# Patient Record
Sex: Female | Born: 1960 | Race: White | Hispanic: No | Marital: Married | Smoking: Never smoker
Health system: Southern US, Community
[De-identification: ages and names within clinical notes are randomized; demographics above are authoritative.]

## PROBLEM LIST (undated history)

## (undated) DIAGNOSIS — R87619 Unspecified abnormal cytological findings in specimens from cervix uteri: Secondary | ICD-10-CM

## (undated) DIAGNOSIS — L292 Pruritus vulvae: Secondary | ICD-10-CM

## (undated) DIAGNOSIS — B3731 Acute candidiasis of vulva and vagina: Secondary | ICD-10-CM

## (undated) DIAGNOSIS — B373 Candidiasis of vulva and vagina: Secondary | ICD-10-CM

## (undated) DIAGNOSIS — I82409 Acute embolism and thrombosis of unspecified deep veins of unspecified lower extremity: Secondary | ICD-10-CM

## (undated) DIAGNOSIS — D049 Carcinoma in situ of skin, unspecified: Secondary | ICD-10-CM

## (undated) DIAGNOSIS — IMO0002 Reserved for concepts with insufficient information to code with codable children: Secondary | ICD-10-CM

## (undated) DIAGNOSIS — R102 Pelvic and perineal pain: Secondary | ICD-10-CM

## (undated) DIAGNOSIS — Z862 Personal history of diseases of the blood and blood-forming organs and certain disorders involving the immune mechanism: Secondary | ICD-10-CM

## (undated) DIAGNOSIS — N7011 Chronic salpingitis: Secondary | ICD-10-CM

## (undated) DIAGNOSIS — Z8742 Personal history of other diseases of the female genital tract: Secondary | ICD-10-CM

## (undated) DIAGNOSIS — M779 Enthesopathy, unspecified: Secondary | ICD-10-CM

## (undated) DIAGNOSIS — G43909 Migraine, unspecified, not intractable, without status migrainosus: Secondary | ICD-10-CM

## (undated) DIAGNOSIS — S83209A Unspecified tear of unspecified meniscus, current injury, unspecified knee, initial encounter: Secondary | ICD-10-CM

## (undated) DIAGNOSIS — R011 Cardiac murmur, unspecified: Secondary | ICD-10-CM

## (undated) HISTORY — PX: CERVICAL CONE BIOPSY: SUR198

## (undated) HISTORY — DX: Migraine, unspecified, not intractable, without status migrainosus: G43.909

## (undated) HISTORY — DX: Unspecified tear of unspecified meniscus, current injury, unspecified knee, initial encounter: S83.209A

## (undated) HISTORY — DX: Acute embolism and thrombosis of unspecified deep veins of unspecified lower extremity: I82.409

## (undated) HISTORY — PX: CYSTECTOMY: SUR359

## (undated) HISTORY — DX: Pelvic and perineal pain: R10.2

## (undated) HISTORY — PX: FOOT SURGERY: SHX648

## (undated) HISTORY — DX: Reserved for concepts with insufficient information to code with codable children: IMO0002

## (undated) HISTORY — DX: Pruritus vulvae: L29.2

## (undated) HISTORY — DX: Unspecified abnormal cytological findings in specimens from cervix uteri: R87.619

## (undated) HISTORY — DX: Chronic salpingitis: N70.11

## (undated) HISTORY — PX: ABDOMINAL HYSTERECTOMY: SHX81

## (undated) HISTORY — DX: Enthesopathy, unspecified: M77.9

## (undated) HISTORY — DX: Personal history of other diseases of the female genital tract: Z87.42

## (undated) HISTORY — DX: Candidiasis of vulva and vagina: B37.3

## (undated) HISTORY — DX: Acute candidiasis of vulva and vagina: B37.31

## (undated) HISTORY — PX: ELBOW SURGERY: SHX618

## (undated) HISTORY — PX: TONSILLECTOMY: SUR1361

## (undated) HISTORY — DX: Personal history of diseases of the blood and blood-forming organs and certain disorders involving the immune mechanism: Z86.2

## (undated) HISTORY — DX: Cardiac murmur, unspecified: R01.1

## (undated) HISTORY — DX: Carcinoma in situ of skin, unspecified: D04.9

---

## 1997-09-08 ENCOUNTER — Other Ambulatory Visit: Admission: RE | Admit: 1997-09-08 | Discharge: 1997-09-08 | Payer: Self-pay | Admitting: Obstetrics and Gynecology

## 1998-07-20 ENCOUNTER — Emergency Department (HOSPITAL_COMMUNITY): Admission: EM | Admit: 1998-07-20 | Discharge: 1998-07-20 | Payer: Self-pay | Admitting: Emergency Medicine

## 1998-08-10 ENCOUNTER — Other Ambulatory Visit: Admission: RE | Admit: 1998-08-10 | Discharge: 1998-08-10 | Payer: Self-pay | Admitting: Obstetrics and Gynecology

## 1999-08-18 ENCOUNTER — Other Ambulatory Visit: Admission: RE | Admit: 1999-08-18 | Discharge: 1999-08-18 | Payer: Self-pay | Admitting: Obstetrics and Gynecology

## 2000-11-12 ENCOUNTER — Other Ambulatory Visit: Admission: RE | Admit: 2000-11-12 | Discharge: 2000-11-12 | Payer: Self-pay | Admitting: Obstetrics and Gynecology

## 2001-04-29 ENCOUNTER — Ambulatory Visit (HOSPITAL_COMMUNITY): Admission: RE | Admit: 2001-04-29 | Discharge: 2001-04-29 | Payer: Self-pay | Admitting: Family Medicine

## 2001-04-29 ENCOUNTER — Encounter: Payer: Self-pay | Admitting: Family Medicine

## 2001-09-30 ENCOUNTER — Ambulatory Visit (HOSPITAL_COMMUNITY): Admission: RE | Admit: 2001-09-30 | Discharge: 2001-09-30 | Payer: Self-pay | Admitting: Obstetrics and Gynecology

## 2001-09-30 ENCOUNTER — Encounter: Payer: Self-pay | Admitting: Obstetrics and Gynecology

## 2001-11-12 ENCOUNTER — Other Ambulatory Visit: Admission: RE | Admit: 2001-11-12 | Discharge: 2001-11-12 | Payer: Self-pay | Admitting: Obstetrics and Gynecology

## 2001-11-26 ENCOUNTER — Ambulatory Visit (HOSPITAL_COMMUNITY): Admission: RE | Admit: 2001-11-26 | Discharge: 2001-11-26 | Payer: Self-pay | Admitting: Obstetrics and Gynecology

## 2001-11-26 ENCOUNTER — Encounter: Payer: Self-pay | Admitting: Obstetrics and Gynecology

## 2002-11-19 ENCOUNTER — Other Ambulatory Visit: Admission: RE | Admit: 2002-11-19 | Discharge: 2002-11-19 | Payer: Self-pay | Admitting: Obstetrics and Gynecology

## 2002-11-28 ENCOUNTER — Ambulatory Visit (HOSPITAL_COMMUNITY): Admission: RE | Admit: 2002-11-28 | Discharge: 2002-11-28 | Payer: Self-pay | Admitting: Obstetrics and Gynecology

## 2002-11-28 ENCOUNTER — Encounter: Payer: Self-pay | Admitting: Obstetrics and Gynecology

## 2003-12-23 ENCOUNTER — Ambulatory Visit (HOSPITAL_COMMUNITY): Admission: RE | Admit: 2003-12-23 | Discharge: 2003-12-23 | Payer: Self-pay | Admitting: Obstetrics and Gynecology

## 2004-10-16 ENCOUNTER — Emergency Department (HOSPITAL_COMMUNITY): Admission: EM | Admit: 2004-10-16 | Discharge: 2004-10-16 | Payer: Self-pay | Admitting: Emergency Medicine

## 2004-12-08 ENCOUNTER — Other Ambulatory Visit: Admission: RE | Admit: 2004-12-08 | Discharge: 2004-12-08 | Payer: Self-pay | Admitting: Obstetrics and Gynecology

## 2004-12-12 ENCOUNTER — Ambulatory Visit (HOSPITAL_COMMUNITY): Admission: RE | Admit: 2004-12-12 | Discharge: 2004-12-12 | Payer: Self-pay | Admitting: Obstetrics and Gynecology

## 2004-12-23 ENCOUNTER — Ambulatory Visit (HOSPITAL_COMMUNITY): Admission: RE | Admit: 2004-12-23 | Discharge: 2004-12-23 | Payer: Self-pay | Admitting: Obstetrics and Gynecology

## 2005-01-03 ENCOUNTER — Encounter: Admission: RE | Admit: 2005-01-03 | Discharge: 2005-01-03 | Payer: Self-pay | Admitting: Obstetrics and Gynecology

## 2006-01-08 ENCOUNTER — Other Ambulatory Visit: Admission: RE | Admit: 2006-01-08 | Discharge: 2006-01-08 | Payer: Self-pay | Admitting: Obstetrics and Gynecology

## 2006-02-22 ENCOUNTER — Encounter: Admission: RE | Admit: 2006-02-22 | Discharge: 2006-02-22 | Payer: Self-pay | Admitting: Obstetrics and Gynecology

## 2007-03-26 ENCOUNTER — Encounter: Admission: RE | Admit: 2007-03-26 | Discharge: 2007-03-26 | Payer: Self-pay | Admitting: Obstetrics and Gynecology

## 2007-03-26 ENCOUNTER — Ambulatory Visit: Payer: Self-pay | Admitting: Cardiology

## 2007-04-08 ENCOUNTER — Ambulatory Visit: Payer: Self-pay

## 2007-04-08 ENCOUNTER — Encounter: Payer: Self-pay | Admitting: Cardiology

## 2007-07-12 ENCOUNTER — Encounter: Admission: RE | Admit: 2007-07-12 | Discharge: 2007-07-12 | Payer: Self-pay | Admitting: Orthopaedic Surgery

## 2008-04-14 ENCOUNTER — Encounter: Admission: RE | Admit: 2008-04-14 | Discharge: 2008-04-14 | Payer: Self-pay | Admitting: Obstetrics and Gynecology

## 2009-04-16 ENCOUNTER — Encounter: Admission: RE | Admit: 2009-04-16 | Discharge: 2009-04-16 | Payer: Self-pay | Admitting: Obstetrics and Gynecology

## 2010-01-10 ENCOUNTER — Ambulatory Visit: Payer: Self-pay | Admitting: Family Medicine

## 2010-01-10 DIAGNOSIS — E663 Overweight: Secondary | ICD-10-CM

## 2010-01-10 DIAGNOSIS — M25559 Pain in unspecified hip: Secondary | ICD-10-CM

## 2010-01-28 ENCOUNTER — Encounter: Admission: RE | Admit: 2010-01-28 | Discharge: 2010-01-28 | Payer: Self-pay | Admitting: Orthopaedic Surgery

## 2010-02-02 ENCOUNTER — Encounter: Admission: RE | Admit: 2010-02-02 | Discharge: 2010-02-02 | Payer: Self-pay | Admitting: Orthopaedic Surgery

## 2010-04-18 ENCOUNTER — Encounter: Admission: RE | Admit: 2010-04-18 | Discharge: 2010-04-18 | Payer: Self-pay | Admitting: Obstetrics and Gynecology

## 2010-05-18 ENCOUNTER — Ambulatory Visit
Admission: RE | Admit: 2010-05-18 | Discharge: 2010-05-18 | Payer: Self-pay | Source: Home / Self Care | Attending: Internal Medicine | Admitting: Internal Medicine

## 2010-06-12 ENCOUNTER — Encounter: Payer: Self-pay | Admitting: Obstetrics and Gynecology

## 2010-06-12 ENCOUNTER — Encounter: Payer: Self-pay | Admitting: Orthopaedic Surgery

## 2010-06-19 LAB — CONVERTED CEMR LAB
Albumin: 4.3 g/dL (ref 3.5–5.2)
Alkaline Phosphatase: 45 units/L (ref 39–117)
BUN: 18 mg/dL (ref 6–23)
Basophils Relative: 0.4 % (ref 0.0–3.0)
Bilirubin Urine: NEGATIVE
Blood in Urine, dipstick: NEGATIVE
CO2: 24 meq/L (ref 19–32)
Calcium: 9.6 mg/dL (ref 8.4–10.5)
Cholesterol: 169 mg/dL (ref 0–200)
Eosinophils Absolute: 0.1 10*3/uL (ref 0.0–0.7)
Eosinophils Relative: 1.6 % (ref 0.0–5.0)
HDL: 51.1 mg/dL (ref >39.00)
Hemoglobin: 14.4 g/dL (ref 12.0–15.0)
Ketones, urine, test strip: NEGATIVE
LDL Cholesterol: 102 mg/dL — ABNORMAL HIGH (ref 0–99)
Monocytes Absolute: 0.5 10*3/uL (ref 0.1–1.0)
Monocytes Relative: 7.6 % (ref 3.0–12.0)
Platelets: 259 10*3/uL (ref 150.0–400.0)
Sodium: 141 meq/L (ref 135–145)
Total Bilirubin: 0.4 mg/dL (ref 0.3–1.2)
Total Protein: 6.7 g/dL (ref 6.0–8.3)
Triglycerides: 79 mg/dL (ref 0.0–149.0)
VLDL: 15.8 mg/dL (ref 0.0–40.0)
WBC: 7.1 10*3/uL (ref 4.5–10.5)

## 2010-06-21 NOTE — Assessment & Plan Note (Signed)
Summary: NEW PT EST // RS   Vital Signs:  Patient profile:   50 year old female Menstrual status:  hysterectomy Height:      59.75 inches Weight:      211 pounds BMI:     41.70 Temp:     98.7 degrees F oral BP sitting:   120 / 80  (left arm) Cuff size:   regular  Vitals Entered By: Kern Reap CMA Duncan Dull) (January 10, 2010 9:50 AM) CC: new to establish  Is Patient Diabetic? No Pain Assessment Patient in pain? no          Menstrual Status hysterectomy Last PAP Date 12/09/2009   CC:  new to establish .  History of Present Illness: Teresa Reese is a 50 year old, married female, nonsmoker, who comes in today as a new patient to get established and to have a complete physical examination  She has a history of underlying migraine headaches for which she takes Topamax 100 mg daily Relpax p.r.n.  Two months ago she began having pain in her left hip.  No history of trauma.  Right hip normal.  She is overweight 211 pounds.  a couple of years ago.  She went on a diet lost 97 pounds, cut down to 160 and then has gained it back.  She had  eye care.  Dental care.  Cardiac evaluation by Dr. Jens Som because of family history cardiomyopathy and heart murmur.  She was told she had a murmur, but is not significant, and echocardiogram, normal.  She gets an annual pelvic examination by GYN.  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  Ovaries were left intact  Tetanus booster 3 years ago.  History of DVT with first pregnancy.  Therefore, no HRT.    Preventive Screening-Counseling & Management  Alcohol-Tobacco     Smoking Status: never  Hep-HIV-STD-Contraception     Dental Visit-last 6 months yes  Allergies (verified): No Known Drug Allergies  Past History:  Past medical, surgical, family and social histories (including risk factors) reviewed, and no changes noted (except as noted below).  Past Medical History: tendonitis torn  meniscus DVT heart murmur migraine headaches  Past Surgical History: Caesarean section Hysterectomy Tonsillectomy cyst removed from wrist right knee surgery torn cartilage  Family History: Reviewed history and no changes required. Father: heart disease Mother: liver transplant 2008 Siblings: 4 brothers - healthy, 1 sister - asthma, migraine  Social History: Reviewed history and no changes required. Occupation: Diplomatic Services operational officer, Musician Married Never Smoked Alcohol use-no Smoking Status:  never Dental Care w/in 6 mos.:  yes  Review of Systems      See HPI  Physical Exam  General:  Well-developed,well-nourished,in no acute distress; alert,appropriate and cooperative throughout examination Head:  Normocephalic and atraumatic without obvious abnormalities. No apparent alopecia or balding. Eyes:  No corneal or conjunctival inflammation noted. EOMI. Perrla. Funduscopic exam benign, without hemorrhages, exudates or papilledema. Vision grossly normal. Ears:  External ear exam shows no significant lesions or deformities.  Otoscopic examination reveals clear canals, tympanic membranes are intact bilaterally without bulging, retraction, inflammation or discharge. Hearing is grossly normal bilaterally. Nose:  External nasal examination shows no deformity or inflammation. Nasal mucosa are pink and moist without lesions or exudates. Mouth:  Oral mucosa and oropharynx without lesions or exudates.  Teeth in good repair. Neck:  No deformities, masses, or tenderness noted. Chest Wall:  No deformities, masses, or tenderness noted. Breasts:  No mass, nodules,  thickening, tenderness, bulging, retraction, inflamation, nipple discharge or skin changes noted.   Lungs:  Normal respiratory effort, chest expands symmetrically. Lungs are clear to auscultation, no crackles or wheezes. Heart:  Normal rate and regular rhythm. S1 and S2 normal without gallop, murmur, click, rub or other extra sounds. Abdomen:   Bowel sounds positive,abdomen soft and non-tender without masses, organomegaly or hernias noted. Msk:  right hip normal left hip, severe pain with external rotation Pulses:  R and L carotid,radial,femoral,dorsalis pedis and posterior tibial pulses are full and equal bilaterally Extremities:  No clubbing, cyanosis, edema, or deformity noted with normal full range of motion of all joints.   Neurologic:  No cranial nerve deficits noted. Station and gait are normal. Plantar reflexes are down-going bilaterally. DTRs are symmetrical throughout. Sensory, motor and coordinative functions appear intact. Skin:  Intact without suspicious lesions or rashes Cervical Nodes:  No lymphadenopathy noted Axillary Nodes:  No palpable lymphadenopathy Inguinal Nodes:  No significant adenopathy Psych:  Cognition and judgment appear intact. Alert and cooperative with normal attention span and concentration. No apparent delusions, illusions, hallucinations   Impression & Recommendations:  Problem # 1:  Preventive Health Care (ICD-V70.0) Assessment Comment Only  Problem # 2:  HIP PAIN, LEFT (ICD-719.45) Assessment: New  Orders: Venipuncture (01027) TLB-Lipid Panel (80061-LIPID) TLB-BMP (Basic Metabolic Panel-BMET) (80048-METABOL) TLB-Hepatic/Liver Function Pnl (80076-HEPATIC) TLB-TSH (Thyroid Stimulating Hormone) (84443-TSH) TLB-CBC Platelet - w/Differential (85025-CBCD) T-Hip Comp Left Min 2-views (73510TC) T-Hip Comp Right Min 2 views (73510TC) Prescription Created Electronically [id number]) UA Dipstick w/o Micro (automated)  (81003) Prescription Created Electronically [id number])  Problem # 3:  OVERWEIGHT (ICD-278.02) Assessment: New  Orders: Venipuncture (74259) TLB-Lipid Panel (80061-LIPID) TLB-BMP (Basic Metabolic Panel-BMET) (80048-METABOL) TLB-Hepatic/Liver Function Pnl (80076-HEPATIC) TLB-TSH (Thyroid Stimulating Hormone) (84443-TSH) TLB-CBC Platelet - w/Differential (85025-CBCD) Prescription  Created Electronically [id number]) UA Dipstick w/o Micro (automated)  (81003) Prescription Created Electronically [id number])  Complete Medication List: 1)  Topamax 100 Mg Tabs (Topiramate) .... Take one tab by mouth once daily 2)  Calcium 1200-1000 Mg-unit Chew (Calcium carbonate-vit d-min) .... Take one tab by mouth once daily 3)  Relpax 20 Mg Tabs (Eletriptan hydrobromide) .Marland Kitchen.. 1 tab on set migraine  headache  Other Orders: EKG w/ Interpretation (93000)  Patient Instructions: 1)  begin a 1500-calorie no carb..............      .  No fat diet......... drink 30 ounces of water a day, daily weights in the morning, I will call you I get the report on your lab work in your x-rays. 2)  Take Motrin 600 mg twice daily with food for the left hip pain 3)  Schedule your mammogram. Prescriptions: RELPAX 20 MG TABS (ELETRIPTAN HYDROBROMIDE) 1 tab on set migraine  headache  #6 x 2   Entered and Authorized by:   Roderick Pee MD   Signed by:   Roderick Pee MD on 01/10/2010   Method used:   Electronically to        [facility]. [phone]* (retail)       [phone]       [place], [place]  [address]       Ph: [phone]       Fax: [phone]   RxID:   [id number] TOPAMAX 100 MG TABS (TOPIRAMATE) take one tab by mouth once daily  #100 x 3   Entered and Authorized by:   Roderick Pee MD   Signed by:   Roderick Pee MD on 01/10/2010  Method used:   Electronically to        [facility]. [phone]* (retail)       [phone]       [place], [place]  [address]       Ph: [phone]       Fax: [phone]   RxID:   [id number]   Laboratory Results   Urine Tests    Routine Urinalysis   Color: yellow Appearance: Clear Glucose: negative   (Normal Range: Negative) Bilirubin: negative   (Normal Range: Negative) Ketone: negative   (Normal Range: Negative) Spec. Gravity: 1.015   (Normal Range:  1.003-1.035) Blood: negative   (Normal Range: Negative) pH: 5.0   (Normal Range: 5.0-8.0) Protein: negative   (Normal Range: Negative) Urobilinogen: 0.2   (Normal Range: 0-1) Nitrite: negative   (Normal Range: Negative) Leukocyte Esterace: negative   (Normal Range: Negative)    Comments: Rita Ohara  January 10, 2010 12:02 PM

## 2010-06-23 NOTE — Assessment & Plan Note (Signed)
Summary: ?sinus inf/cjr   Vital Signs:  Patient profile:   50 year old female Menstrual status:  hysterectomy Weight:      213 pounds Pulse rate:   64 / minute BP sitting:   116 / 78  (left arm)  Vitals Entered By: Kyung Rudd, CMA (May 18, 2010 9:14 AM) CC: ?sinus inf   CC:  ?sinus inf.  History of Present Illness: Patient presents to clinic as a workin for evaluation of sinus discomfort. Notes 1+wk h/o frontal and B maxillary sinus pain and pressure. +green/yellow nasal drainage and cough prod for green sputum. No f/c or teeth pain. Does have h/o recurrent sinusitis typically responsive to abx tx. No sick exposure.  Current Medications (verified): 1)  Topamax 100 Mg Tabs (Topiramate) .... Take One Tab By Mouth Once Daily 2)  Calcium 1200-1000 Mg-Unit Chew (Calcium Carbonate-Vit D-Min) .... Take One Tab By Mouth Once Daily 3)  Relpax 20 Mg Tabs (Eletriptan Hydrobromide) .Marland Kitchen.. 1 Tab On Set Migraine  Headache  Allergies (verified): No Known Drug Allergies  Past History:  Past medical, surgical, family and social histories (including risk factors) reviewed for relevance to current acute and chronic problems.  Past Medical History: Reviewed history from 01/10/2010 and no changes required. tendonitis torn meniscus DVT heart murmur migraine headaches  Past Surgical History: Reviewed history from 01/10/2010 and no changes required. Caesarean section Hysterectomy Tonsillectomy cyst removed from wrist right knee surgery torn cartilage  Family History: Reviewed history from 01/10/2010 and no changes required. Father: heart disease Mother: liver transplant 2008 Siblings: 4 brothers - healthy, 1 sister - asthma, migraine  Social History: Reviewed history from 01/10/2010 and no changes required. Occupation: Diplomatic Services operational officer, Musician Married Never Smoked Alcohol use-no  Review of Systems      See HPI General:  Denies chills, fever, and sweats. Eyes:  Denies  discharge, eye irritation, eye pain, and red eye. ENT:  Complains of nasal congestion and sinus pressure; denies difficulty swallowing, ear discharge, earache, and sore throat. Resp:  Complains of cough and sputum productive; denies coughing up blood, shortness of breath, and wheezing.  Physical Exam  General:  Well-developed,well-nourished,in no acute distress; alert,appropriate and cooperative throughout examination Head:  Normocephalic and atraumatic without obvious abnormalities. No apparent alopecia or balding.+tenderness over maxillary and frontal sinuses Eyes:  pupils equal, pupils round, corneas and lenses clear, and no injection.   Ears:  External ear exam shows no significant lesions or deformities.  Otoscopic examination reveals clear canals, tympanic membranes are intact bilaterally without bulging, retraction, inflammation or discharge. Hearing is grossly normal bilaterally. Nose:  no external erythema and no nasal discharge.   Mouth:  Oral mucosa and oropharynx without lesions or exudates.  Teeth in good repair. Neck:  No deformities, masses, or tenderness noted. Lungs:  Normal respiratory effort, chest expands symmetrically. Lungs are clear to auscultation, no crackles or wheezes. Heart:  Normal rate and regular rhythm. S1 and S2 normal without gallop, murmur, click, rub or other extra sounds.   Impression & Recommendations:  Problem # 1:  ACUTE MAXILLARY SINUSITIS (ICD-461.0) Assessment New Begin abx tx as well as antitussive as needed. Cautioned regarding possible sedating effect. Followup if no improvement or worsening.  Her updated medication list for this problem includes:    Amoxicillin 875 Mg Tabs (Amoxicillin) ..... One by mouth bid    Hydrocod Polst-chlorphen Polst 10-8 Mg/51ml Lqcr (Hydrocod polst-chlorphen polst) .Marland KitchenMarland KitchenMarland KitchenMarland Kitchen 5ml po q12 hours as needed cough  Complete Medication List: 1)  Topamax 100 Mg Tabs (  Topiramate) .... Take one tab by mouth once daily 2)  Calcium  1200-1000 Mg-unit Chew (Calcium carbonate-vit d-min) .... Take one tab by mouth once daily 3)  Relpax 20 Mg Tabs (Eletriptan hydrobromide) .Marland Kitchen.. 1 tab on set migraine  headache 4)  Amoxicillin 875 Mg Tabs (Amoxicillin) .... One by mouth bid 5)  Hydrocod Polst-chlorphen Polst 10-8 Mg/81ml Lqcr (Hydrocod polst-chlorphen polst) .... 5ml po q12 hours as needed cough Prescriptions: HYDROCOD POLST-CHLORPHEN POLST 10-8 MG/5ML LQCR (HYDROCOD POLST-CHLORPHEN POLST) 5ml po q12 hours as needed cough  #4 ounces x 0   Entered and Authorized by:   Edwyna Perfect MD   Signed by:   Edwyna Perfect MD on 05/18/2010   Method used:   Print then Give to Patient   RxID:   [id number] AMOXICILLIN 875 MG TABS (AMOXICILLIN) one by mouth bid  #14 x 0   Entered and Authorized by:   Edwyna Perfect MD   Signed by:   Edwyna Perfect MD on 05/18/2010   Method used:   Print then Give to Patient   RxID:   [id number]    Orders Added: 1)  Est. Patient Level III [84696]

## 2010-06-28 ENCOUNTER — Telehealth: Payer: Self-pay | Admitting: Family Medicine

## 2010-06-28 MED ORDER — ELETRIPTAN HYDROBROMIDE 40 MG PO TABS: 40.0000 mg | ORAL_TABLET | ORAL | Status: DC | PRN

## 2010-06-28 NOTE — Telephone Encounter (Signed)
Rx Relpax 40mg  every 7 days [facility] at [address]

## 2010-08-09 ENCOUNTER — Encounter: Payer: Self-pay | Admitting: Family Medicine

## 2010-08-09 ENCOUNTER — Ambulatory Visit (INDEPENDENT_AMBULATORY_CARE_PROVIDER_SITE_OTHER): Payer: 59 | Admitting: Family Medicine

## 2010-08-09 VITALS — BP 130/88 | Temp 99.4°F | Wt 218.0 lb

## 2010-08-09 DIAGNOSIS — J45901 Unspecified asthma with (acute) exacerbation: Secondary | ICD-10-CM

## 2010-08-09 MED ORDER — HYDROCODONE-HOMATROPINE 5-1.5 MG/5ML PO SYRP: 2.5000 mL | ORAL_SOLUTION | Freq: Four times a day (QID) | ORAL | Status: AC | PRN

## 2010-08-09 MED ORDER — PREDNISONE 20 MG PO TABS: ORAL_TABLET | ORAL | Status: DC

## 2010-08-09 NOTE — Progress Notes (Signed)
  Subjective:    Patient ID: Teresa Reese, female    DOB: October 31, 1960, 50 y.o.   MRN: [id number]  HPI  Kristl is a 50 year old managing a nonsmoker, who comes in today with a 5-day history of fever, chills, and cough.  She had fever, chills, for for 5 days.  They are gone away, but the cough has gotten worse.  In the, past.  She's had pneumonia twice.  She's also had a history of wheezing when she gets a viral syndrome on occasion.  Review of Systems    General and pulmonary views systems otherwise negative Objective:   Physical Exam    Well-developed well-nourished, female, in no acute distress.  HEENT negative.  Neck supple.  No adenopathy.  Lungs are clear except for late expiratory wheezing    Assessment & Plan:  Viral syndrome with secondary asthma....... Prednisone burst and taper...... Lots of water......... Hydromet p.r.n. cough

## 2010-08-09 NOTE — Patient Instructions (Signed)
Take to prednisone tablets now then starting tomorrow morning two tabs every morning for 3 days or until you feel a lot better........ Then taper by taking one tab x 3 days, a half x 3 days, then half a tablet Monday, Wednesday, Friday, for a two week taper.  Drink lots of water.  Hydromet one half to 1 teaspoon nightly p.r.n. Cough.  Return p.r.n.

## 2010-08-15 ENCOUNTER — Telehealth: Payer: Self-pay | Admitting: *Deleted

## 2010-08-15 NOTE — Telephone Encounter (Signed)
Office visit for evaluation

## 2010-08-15 NOTE — Telephone Encounter (Signed)
Pt. Thinks she got a sinus infection from the prednisone and has teeth and face pain with drainage.  [phone] [facility].

## 2010-08-16 NOTE — Telephone Encounter (Signed)
Done

## 2010-08-17 ENCOUNTER — Encounter: Payer: Self-pay | Admitting: Family Medicine

## 2010-08-17 ENCOUNTER — Ambulatory Visit (INDEPENDENT_AMBULATORY_CARE_PROVIDER_SITE_OTHER): Payer: 59 | Admitting: Family Medicine

## 2010-08-17 DIAGNOSIS — IMO0001 Reserved for inherently not codable concepts without codable children: Secondary | ICD-10-CM

## 2010-08-17 DIAGNOSIS — J45901 Unspecified asthma with (acute) exacerbation: Secondary | ICD-10-CM

## 2010-08-17 DIAGNOSIS — J301 Allergic rhinitis due to pollen: Secondary | ICD-10-CM

## 2010-08-17 MED ORDER — AMOXICILLIN 500 MG PO CAPS: ORAL_CAPSULE | ORAL | Status: AC

## 2010-08-17 NOTE — Patient Instructions (Signed)
Prednisone two tabs x 3 days, one x 3 days, a half x 3 days, then half a tablet Monday, Wednesday, Friday, for a two week taper.  Afrin nasal spray, one shot up each nostril at bedtime.  If after one week.  He still did not see any improvement with the increase in prednisone and then begin amoxicillin 500 mg 3 times a day until bottle empty.  Remember antibiotics can cause vaginitis and/or diarrhea.  If you get diarrhea stopped the medicine immediately and call us

## 2010-08-17 NOTE — Progress Notes (Signed)
  Subjective:    Patient ID: Teresa Reese, female    DOB: August 08, 1960, 50 y.o.   MRN: [id number]  Teresa Reese is a 50 year old, married female, nonsmoker, who comes back today for evaluation of allergic rhinitis and asthma.  We saw a week ago with allergic rhinitis and asthma and began a short course of prednisone 40 mg daily x 3 days with a taper.  She states the wheezing is gone, but she still has a lot of head congestion.  Review of systems otherwise negative.    Review of Systems    General and immunologic review of systems otherwise negative Objective:   Physical Exam    Well-developed well-nourished, female in no acute distress.  HEENT negative except for 3+ nasal edema.  Next upper lungs are clear.  No wheezing    Assessment & Plan:  Allergic rhinitis, asthma.......Marland Kitchen Asthma resolved,,,,,,,,, plan go back up on a prednisone 40 mg daily x 3 days with a taper and afrin nasal spray nightly x 5 nights.  Patient is insistent that she has a sinus infection.  Therefore, I will give her a prescription for amoxicillin to take if her symptoms do not abate after we increased the prednisone

## 2010-10-04 NOTE — Assessment & Plan Note (Signed)
[facility]                            CARDIOLOGY OFFICE NOTE   JAMIEKA, ROYLE                       MRN:          [id number]  DATE:03/26/2007                            DOB:          1960/09/06    HISTORY:  Ms. Culliton is a pleasant 50 year old female who presents for  evaluation of chest pain and a murmur.  I saw her in August 2004, for  atypical chest pain.  At that time we did perform an echocardiogram that  showed normal LV function.  There was no significant valvular  abnormalities noted.  Note, she typically does not have significant  dyspnea on exertion, orthopnea, PND, pedal edema, palpitations, pre-  syncope, syncope or exertional chest pain.  She has recently been seen  by a physician who has told her she has a murmur and asked that she be  evaluated.  She also states that she recently began using the treadmill.  After approximately 20 minutes, she fells pain in the substernal area.  It lasts for one to two seconds and resolves spontaneously.  It does not  radiate.  It it positional.  As she continues to exercise, she does not  have any further symptoms.  There is no associated nausea, vomiting or  diaphoresis.  There is no  shortness of breath.  Because of the above,  we were asked to further evaluate.   ALLERGIES:  No known drug allergies.   MEDICATIONS:  1. Calcium daily.  2. Topamax daily.  3. Vitamin C daily.  4. Sodium bicarbonate daily.   SOCIAL HISTORY:  She does not smoke, nor does she consume alcohol.   FAMILY HISTORY:  Significant for atrial fibrillation, as well as cardiac  enlargement in her father.  There is no premature coronary artery  disease noted.   PAST MEDICAL/SURGICAL HISTORY:  Significant for no hypertension,  diabetes mellitus or hyperlipidemia.  She does have a history of a prior  hysterectomy as well as a tonsillectomy.  She has had bilateral elbow  surgery.  She also has had foot surgery.  She had a  deep venous  thrombosis when she was pregnant in the past.   REVIEW OF SYSTEMS:  She denies any headaches or fevers or chills.  Note  that she does occasionally have migraines.  There is no productive cough  or hemoptysis.  There is no dysphagia, odynophagia, melena or  hematochezia.  There is no dysuria or hematuria.  There is no seizure  activity.  There is no orthopnea, PND or pedal edema.  The remainder of  the review of systems is negative.   PHYSICAL EXAMINATION:  VITAL SIGNS:  Today shows a blood pressure of  140/83, pulse 93.  She weighs 199 pounds.  GENERAL:  She is well-developed, somewhat obese.  She is in no acute  distress at present.  She is not acutely depressed.  SKIN:  Warm and dry.  EXTREMITIES:  There is no peripheral clubbing.  She has 2+ femoral  pulses bilaterally, no bruits.  The extremities show no edema.  I can  palpate no cords.  She has 2+ posterior tibial pulses bilaterally.  HEENT:  Normal with normal eye lids.  NECK:  Supple with a normal upstroke bilaterally.  No bruits.  There is  no jugular venous distention and I cannot detect thyromegaly.  CHEST:  Clear to auscultation, normal expansion.  CARDIOVASCULAR:  A regular rate and rhythm.  Normal S1 and S2.  There is  a soft 1/6 systolic ejection murmur heard at the left sternal border.  There is no S3 or S4.  ABDOMEN:  Nontender.  Positive bowel sounds.  No hepatosplenomegaly, no  masses appreciated.  There is no abdominal bruit.  NEUROLOGIC:  Grossly intact.   Her electrocardiogram shows a sinus rhythm at a rate of 95.  There are  no significant ST changes.   DIAGNOSES/PLAN:  1. Atypical chest pain - etiology of this is unclear.  It may be      musculoskeletal.  I am not convinced that this is cardiac:  Will      schedule her to have a stress echocardiogram both to risk stratify      and also to evaluate a murmur.  If it is unremarkable, then I would      not proceed with further cardiac workup.  2.  Murmur - appears to be an ejection murmur:  We will evaluate the      echocardiogram as described in number one.  3. Family history of cardiomyopathy, atrial fibrillation and valvular      disease:  Again, we will proceed with an echocardiogram to more      fully quantify the patient's left ventricular function.   FOLLOWUP:  I will see her back on an as-needed basis, pending the  results.     Madolyn Frieze Jens Som, MD, [facility]  Electronically Signed    BSC/MedQ  DD: 03/26/2007  DT: 03/27/2007  Job #: [id number]

## 2011-01-06 ENCOUNTER — Other Ambulatory Visit: Payer: Self-pay | Admitting: Family Medicine

## 2011-02-02 ENCOUNTER — Other Ambulatory Visit: Payer: Self-pay | Admitting: Family Medicine

## 2011-03-28 ENCOUNTER — Other Ambulatory Visit: Payer: Self-pay | Admitting: Obstetrics and Gynecology

## 2011-03-28 DIAGNOSIS — Z1231 Encounter for screening mammogram for malignant neoplasm of breast: Secondary | ICD-10-CM

## 2011-04-21 ENCOUNTER — Ambulatory Visit: Payer: 59

## 2011-04-24 ENCOUNTER — Ambulatory Visit
Admission: RE | Admit: 2011-04-24 | Discharge: 2011-04-24 | Disposition: A | Discharge status: Home / Self Care | Payer: 59 | Source: Ambulatory Visit | Attending: Obstetrics and Gynecology | Admitting: Obstetrics and Gynecology

## 2011-04-24 DIAGNOSIS — Z1231 Encounter for screening mammogram for malignant neoplasm of breast: Secondary | ICD-10-CM

## 2011-07-17 ENCOUNTER — Ambulatory Visit (INDEPENDENT_AMBULATORY_CARE_PROVIDER_SITE_OTHER): Payer: 59 | Admitting: Family Medicine

## 2011-07-17 ENCOUNTER — Encounter: Payer: Self-pay | Admitting: Family Medicine

## 2011-07-17 ENCOUNTER — Telehealth: Payer: Self-pay | Admitting: *Deleted

## 2011-07-17 VITALS — BP 108/78 | Temp 98.3°F | Wt 215.0 lb

## 2011-07-17 DIAGNOSIS — R55 Syncope and collapse: Secondary | ICD-10-CM

## 2011-07-17 NOTE — Telephone Encounter (Signed)
Per Dr. Tawanna Cooler "have pt come in now".

## 2011-07-17 NOTE — Patient Instructions (Signed)
In the future if you feel lightheaded and feel like you're going to pass out lied down and put her feet up in the air  Return when necessary

## 2011-07-17 NOTE — Progress Notes (Signed)
  Subjective:    Patient ID: Teresa Reese, female    DOB: September 12, 1960, 51 y.o.   MRN: [id number]  HPI Teresa Reese is a 51 year old married female nonsmoker who comes in today for evaluation of a syncopal episode  She states she was in the shower this morning taking a nice morning hot shower and she began to feel lightheaded. She stood there for a while and then sat down the past. She woke up tried to stand up suddenly and passed out again. The first episode she bumped the right side of her head the second the left side. She feels fine now except for some soreness in her scalp and her hip.  She's had a history of syncopal episodes in the past over the last episode was many years ago when she was pregnant. She gave blood last week for the first time in a couple years blood pressure was 130/70. No history of anemia.   Review of Systems    general and neurologic review of systems otherwise negative Objective:   Physical Exam  Well-developed well-nourished female overweight no acute distress,,,,,,,,, she's been on a 1600-calorie weight loss diet,,,,,,,,, HEENT were negative except for 2 small contusions right upper and left upper frontal area of the scalp at the hairline. No bruises on the hips no palpable spinal tenderness. BP right arm sitting position 130/70 pulse 70 and regular cardiovascular exam normal      Assessment & Plan:  Syncopal episode with no neurologic sequelae plan observe return when necessary advised to go to ground the next time she has a nicked episode like this so she doesn't fall and hurt her self

## 2011-07-17 NOTE — Telephone Encounter (Addendum)
Pt passed out twice in the shower this am, and once her husband had to wake her up.  Wants to come to the office.  Basically, is having pain all over.

## 2011-10-12 ENCOUNTER — Other Ambulatory Visit: Payer: Self-pay | Admitting: Family Medicine

## 2011-12-22 DIAGNOSIS — Z8742 Personal history of other diseases of the female genital tract: Secondary | ICD-10-CM | POA: Insufficient documentation

## 2011-12-22 DIAGNOSIS — Z862 Personal history of diseases of the blood and blood-forming organs and certain disorders involving the immune mechanism: Secondary | ICD-10-CM

## 2011-12-22 DIAGNOSIS — M779 Enthesopathy, unspecified: Secondary | ICD-10-CM | POA: Insufficient documentation

## 2011-12-22 DIAGNOSIS — B373 Candidiasis of vulva and vagina: Secondary | ICD-10-CM | POA: Insufficient documentation

## 2011-12-22 DIAGNOSIS — D049 Carcinoma in situ of skin, unspecified: Secondary | ICD-10-CM | POA: Insufficient documentation

## 2011-12-22 DIAGNOSIS — N7011 Chronic salpingitis: Secondary | ICD-10-CM | POA: Insufficient documentation

## 2011-12-22 DIAGNOSIS — R011 Cardiac murmur, unspecified: Secondary | ICD-10-CM | POA: Insufficient documentation

## 2011-12-22 DIAGNOSIS — L292 Pruritus vulvae: Secondary | ICD-10-CM | POA: Insufficient documentation

## 2011-12-22 DIAGNOSIS — R102 Pelvic and perineal pain: Secondary | ICD-10-CM | POA: Insufficient documentation

## 2011-12-22 DIAGNOSIS — G43909 Migraine, unspecified, not intractable, without status migrainosus: Secondary | ICD-10-CM | POA: Insufficient documentation

## 2011-12-22 DIAGNOSIS — IMO0002 Reserved for concepts with insufficient information to code with codable children: Secondary | ICD-10-CM

## 2011-12-22 DIAGNOSIS — I82409 Acute embolism and thrombosis of unspecified deep veins of unspecified lower extremity: Secondary | ICD-10-CM | POA: Insufficient documentation

## 2011-12-22 DIAGNOSIS — S83209A Unspecified tear of unspecified meniscus, current injury, unspecified knee, initial encounter: Secondary | ICD-10-CM | POA: Insufficient documentation

## 2011-12-25 ENCOUNTER — Ambulatory Visit (INDEPENDENT_AMBULATORY_CARE_PROVIDER_SITE_OTHER): Payer: 59 | Admitting: Obstetrics and Gynecology

## 2011-12-25 ENCOUNTER — Encounter: Payer: Self-pay | Admitting: Obstetrics and Gynecology

## 2011-12-25 VITALS — BP 122/72 | Ht 59.75 in | Wt 213.0 lb

## 2011-12-25 DIAGNOSIS — IMO0002 Reserved for concepts with insufficient information to code with codable children: Secondary | ICD-10-CM

## 2011-12-25 DIAGNOSIS — R102 Pelvic and perineal pain: Secondary | ICD-10-CM

## 2011-12-25 DIAGNOSIS — D049 Carcinoma in situ of skin, unspecified: Secondary | ICD-10-CM

## 2011-12-25 DIAGNOSIS — N949 Unspecified condition associated with female genital organs and menstrual cycle: Secondary | ICD-10-CM

## 2011-12-25 DIAGNOSIS — N898 Other specified noninflammatory disorders of vagina: Secondary | ICD-10-CM

## 2011-12-25 DIAGNOSIS — R6889 Other general symptoms and signs: Secondary | ICD-10-CM

## 2011-12-25 LAB — POCT WET PREP (WET MOUNT)
Clue Cells Wet Prep Whiff POC: NEGATIVE
pH: 4.5

## 2011-12-25 NOTE — Progress Notes (Signed)
Last Pap: 12/09/2009 WNL: Yes Regular Periods:no Contraception: Hysterectomy  Monthly Breast exam:yes Tetanus<25yrs:yes Nl.Bladder Function:yes Daily BMs:yes Healthy Diet:yes Calcium:yes Mammogram:yes Date of Mammogram: 04/2011 Exercise:yes Have often Exercise: 3 times weekly Seatbelt: yes Abuse at home: no Stressful work:no Sigmoid-colonoscopy: 01/2011 Bone Density: No PCP: Dr. Tawanna Cooler Change in PMH: None Change in [facility]: None Subjective:    Teresa Reese is a 51 y.o. female [id number] who presents for annual exam.  She complains of sx about every other month of vaginal itching and pelvic cramping which she associates with yeast infection.  She has done OTC 7 day antifungal treatment with relief. She denies specific urinary or GI symptoms. She has a remote history of Bowen's disease but has noted no vulvar lesions.  The following portions of the patient's history were reviewed and updated as appropriate: allergies, current medications, past family history, past medical history, past social history, past surgical history and problem list.  Review of Systems Pertinent items are noted in HPI. Gastrointestinal:No change in bowel habits, no abdominal pain, no rectal bleeding Genitourinary:negative for dysuria, frequency, hematuria, nocturia and urinary incontinence    Objective:     BP 122/72  Ht 4' 11.75" (1.518 m)  Wt 213 lb (96.616 kg)  BMI 41.95 kg/m2  Weight:  Wt Readings from Last 1 Encounters:  12/25/11 213 lb (96.616 kg)     BMI: Body mass index is 41.95 kg/(m^2). General Appearance: Alert, appropriate appearance for age. No acute distress HEENT: Grossly normal Neck / Thyroid: Supple, no masses, nodes or enlargement Lungs: clear to auscultation bilaterally Back: No CVA tenderness Breast Exam: No masses or nodes.No dimpling, nipple retraction or discharge. Cardiovascular: Regular rate and rhythm. S1, S2, no murmur Gastrointestinal: Soft, non-tender, no masses or  organomegaly.  Exam compromised by patient habitus Pelvic Exam: External genitalia: normal general appearance:  No lesions Vaginal: normal mucosa without prolapse or lesions and vaginal vault, well suspended Cervix: removed surgically Adnexa: non palpable Uterus: removed surgically Rectovaginal: normal rectal, no masses Lymphatic Exam: Non-palpable nodes in neck, clavicular, axillary, or inguinal regions Skin: no rash or abnormalities Neurologic: Normal gait and speech, no tremor  Psychiatric: Alert and oriented, appropriate affect.   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

## 2011-12-26 NOTE — Addendum Note (Signed)
Addended by: Lerry Liner D on: 12/26/2011 08:51 AM   Modules accepted: Orders

## 2012-01-15 ENCOUNTER — Other Ambulatory Visit: Payer: Self-pay | Admitting: Obstetrics and Gynecology

## 2012-01-15 ENCOUNTER — Ambulatory Visit (INDEPENDENT_AMBULATORY_CARE_PROVIDER_SITE_OTHER): Payer: 59

## 2012-01-15 ENCOUNTER — Ambulatory Visit (INDEPENDENT_AMBULATORY_CARE_PROVIDER_SITE_OTHER): Payer: 59 | Admitting: Obstetrics and Gynecology

## 2012-01-15 ENCOUNTER — Encounter: Payer: Self-pay | Admitting: Obstetrics and Gynecology

## 2012-01-15 VITALS — BP 114/80 | Temp 98.7°F | Wt 213.0 lb

## 2012-01-15 DIAGNOSIS — R102 Pelvic and perineal pain: Secondary | ICD-10-CM

## 2012-01-15 DIAGNOSIS — N949 Unspecified condition associated with female genital organs and menstrual cycle: Secondary | ICD-10-CM

## 2012-01-15 LAB — POCT URINALYSIS DIPSTICK
Bilirubin, UA: NEGATIVE
Glucose, UA: NEGATIVE
Leukocytes, UA: NEGATIVE
Nitrite, UA: NEGATIVE
Urobilinogen, UA: NEGATIVE

## 2012-01-15 MED ORDER — FLUCONAZOLE 150 MG PO TABS: ORAL_TABLET | ORAL | Status: DC

## 2012-01-15 NOTE — Progress Notes (Signed)
GYN PROBLEM VISIT  [name] is a 51 y.o. year old female,G3P0012, who presents for followup  Subjective: She has had symptoms which she attributed to yeast infection including pelvic pressure.  These have resolved with the use of antifungal agents.  Within the  Objective:  BP 114/80  Temp 98.7 F (37.1 C)  Wt 213 lb (96.616 kg)   ULTRASOUND: Uterus: Surgically absent       Endo thickness: Surgically absent Left ovary:Normal Right ovary:Abnormal - Simple cyst/follicle. Measures: 1.6cm x 1.2cm x 1.5cm Fibroids:no   CDS fluid:no  Comment: Right adnexa. Simple, cystic tubular shaped structure superior to RTOV. 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    Assessment: Minimal U/S findings    Plan: Diflucan 150  mg monthly per plan from last visit  Return to office in 6 month(s).   Dierdre Forth, MD  01/15/2012 6:27 PM

## 2012-02-05 ENCOUNTER — Ambulatory Visit: Payer: 59 | Admitting: Family Medicine

## 2012-02-13 ENCOUNTER — Other Ambulatory Visit: Payer: Self-pay | Admitting: Family Medicine

## 2012-02-13 ENCOUNTER — Telehealth: Payer: Self-pay | Admitting: Family Medicine

## 2012-02-13 NOTE — Telephone Encounter (Signed)
Pharmacist needs clarification on qty of topiramate (TOPAMAX) 100 MG tablet

## 2012-02-14 ENCOUNTER — Telehealth: Payer: Self-pay | Admitting: Family Medicine

## 2012-02-14 NOTE — Telephone Encounter (Signed)
Caller: Teresa Reese/Patient; Phone: [phone]; Reason for Call: Calling regarding a prescription they received yesterday for Topiramate.  Pharmacy has question about the quantity.  Please call Teresa Reese back.  Thanks

## 2012-02-15 ENCOUNTER — Telehealth: Payer: Self-pay | Admitting: Family Medicine

## 2012-02-15 MED ORDER — TOPIRAMATE 100 MG PO TABS: 100.0000 mg | ORAL_TABLET | Freq: Every day | ORAL | Status: DC

## 2012-02-15 NOTE — Telephone Encounter (Signed)
New Rx sent.

## 2012-02-15 NOTE — Telephone Encounter (Signed)
Caller: [employer]; Phone: [phone]; Reason for Call: [facility] calling regarding prescription for Topiramate, needs to clarify the quantity. Please call Pharmacy back. Thanks

## 2012-04-05 ENCOUNTER — Other Ambulatory Visit: Payer: Self-pay | Admitting: Obstetrics and Gynecology

## 2012-04-05 DIAGNOSIS — Z1231 Encounter for screening mammogram for malignant neoplasm of breast: Secondary | ICD-10-CM

## 2012-04-08 ENCOUNTER — Other Ambulatory Visit (INDEPENDENT_AMBULATORY_CARE_PROVIDER_SITE_OTHER): Payer: 59

## 2012-04-08 DIAGNOSIS — Z0000 Encounter for general adult medical examination without abnormal findings: Secondary | ICD-10-CM

## 2012-04-08 LAB — CBC WITH DIFFERENTIAL/PLATELET
Basophils Absolute: 0 10*3/uL (ref 0.0–0.1)
Eosinophils Absolute: 0.2 10*3/uL (ref 0.0–0.7)
HCT: 41.4 % (ref 36.0–46.0)
Lymphs Abs: 1.9 10*3/uL (ref 0.7–4.0)
MCHC: 33 g/dL (ref 30.0–36.0)
Monocytes Relative: 8.1 % (ref 3.0–12.0)
Platelets: 296 10*3/uL (ref 150.0–400.0)
RDW: 13.3 % (ref 11.5–14.6)

## 2012-04-08 LAB — POCT URINALYSIS DIPSTICK
Bilirubin, UA: NEGATIVE
Blood, UA: NEGATIVE
Glucose, UA: NEGATIVE
Leukocytes, UA: NEGATIVE
Nitrite, UA: NEGATIVE
Urobilinogen, UA: 0.2

## 2012-04-08 LAB — TSH: TSH: 0.92 u[IU]/mL (ref 0.35–5.50)

## 2012-04-08 LAB — HEPATIC FUNCTION PANEL
AST: 23 U/L (ref 0–37)
Total Bilirubin: 0.6 mg/dL (ref 0.3–1.2)

## 2012-04-08 LAB — LIPID PANEL
Cholesterol: 208 mg/dL — ABNORMAL HIGH (ref 0–200)
HDL: 61.7 mg/dL (ref >39.00)
Triglycerides: 137 mg/dL (ref 0.0–149.0)

## 2012-04-08 LAB — BASIC METABOLIC PANEL
BUN: 17 mg/dL (ref 6–23)
Chloride: 106 mEq/L (ref 96–112)
GFR: 89.21 mL/min (ref >60.00)
Potassium: 3.6 mEq/L (ref 3.5–5.1)
Sodium: 139 mEq/L (ref 135–145)

## 2012-04-15 ENCOUNTER — Encounter: Payer: Self-pay | Admitting: Family Medicine

## 2012-04-15 ENCOUNTER — Ambulatory Visit (INDEPENDENT_AMBULATORY_CARE_PROVIDER_SITE_OTHER): Payer: 59 | Admitting: Family Medicine

## 2012-04-15 VITALS — BP 120/80 | Temp 98.3°F | Ht 59.5 in | Wt 221.0 lb

## 2012-04-15 DIAGNOSIS — E663 Overweight: Secondary | ICD-10-CM

## 2012-04-15 DIAGNOSIS — Z23 Encounter for immunization: Secondary | ICD-10-CM

## 2012-04-15 DIAGNOSIS — G43909 Migraine, unspecified, not intractable, without status migrainosus: Secondary | ICD-10-CM

## 2012-04-15 MED ORDER — TOPIRAMATE 25 MG PO TABS: 25.0000 mg | ORAL_TABLET | Freq: Two times a day (BID) | ORAL | Status: DC

## 2012-04-15 MED ORDER — TOPIRAMATE 100 MG PO TABS: 100.0000 mg | ORAL_TABLET | Freq: Every day | ORAL | Status: DC

## 2012-04-15 MED ORDER — ELETRIPTAN HYDROBROMIDE 40 MG PO TABS: 40.0000 mg | ORAL_TABLET | ORAL | Status: DC | PRN

## 2012-04-15 NOTE — Patient Instructions (Signed)
Let's increase the Topamax to 125 mg daily at bedtime  Return in 2 months for followup

## 2012-04-15 NOTE — Progress Notes (Signed)
  Subjective:    Patient ID: Teresa Reese, female    DOB: [birth year], 51 y.o.   MRN: [id number]  HPI [name] is a 51 year old married female nonsmoker who comes in today for general physical examination  She's always been in excellent health she's had no chronic health problems except for migraine headaches  She takes Topamax 100 mg daily but his have been used more of the Relpax for breakthrough migraines. We discussed various options she would like to try to increase the Topamax  She has itching in the right ear  She also has underlying allergic rhinitis  She gets routine eye care, dental care, BSE monthly, and you mammography, colonoscopy last year normal.  She's had episodes of vertigo that come and go  She had her uterus removed ovaries were left intact however she developed a Bowen's disease of the posterior portion of her vagina. She went underwent extensive surgery and she is checked by her GYN yearly  Weight is 221 she declines a weight loss program  Tetanus booster 2006 seasonal flu shot today   Review of Systems  Constitutional: Negative.        Bilateral breast exam normal  HENT: Negative.   Eyes: Negative.   Respiratory: Negative.   Cardiovascular: Negative.   Gastrointestinal: Negative.   Genitourinary: Negative.   Musculoskeletal: Negative.   Skin: Negative.        Total body skin exam shows a garden-variety of freckles capillary hemangiomas. Careful inspection of the vagina was normal except for some scar tissue in the posterior portion of her vagina from previous surgery.  Neurological: Negative.   Hematological: Negative.   Psychiatric/Behavioral: Negative.        Objective:   Physical Exam  Constitutional: She appears well-developed and well-nourished.  HENT:  Head: Normocephalic and atraumatic.  Right Ear: External ear normal.  Left Ear: External ear normal.  Nose: Nose normal.  Mouth/Throat: Oropharynx is clear and moist.  Eyes: EOM are normal.  Pupils are equal, round, and reactive to light.  Neck: Normal range of motion. Neck supple. No thyromegaly present.  Cardiovascular: Normal rate, regular rhythm, normal heart sounds and intact distal pulses.  Exam reveals no gallop and no friction rub.   No murmur heard. Pulmonary/Chest: Effort normal and breath sounds normal.  Abdominal: Soft. Bowel sounds are normal. She exhibits no distension and no mass. There is no tenderness. There is no rebound.  Genitourinary: Vagina normal.       Bilateral breast exam normal  Musculoskeletal: Normal range of motion.  Lymphadenopathy:    She has no cervical adenopathy.  Neurological: She is alert. She has normal reflexes. No cranial nerve deficit. She exhibits normal muscle tone. Coordination normal.  Skin: Skin is warm and dry.  Psychiatric: She has a normal mood and affect. Her behavior is normal. Judgment and thought content normal.          Assessment & Plan:  Healthy female  Height 59-1/2 inches weight 222 pounds,,,,,,,,,,, patient declines a formal weight loss program  Status post hysterectomy  History of vaginal cancer Bowen's disease  Inflammation of right ear canal steroid drops each bedtime when necessary  Allergic rhinitis over-the-counter Claritin  Migraine headaches increase Topamax to 150 mg daily

## 2012-05-01 ENCOUNTER — Ambulatory Visit (INDEPENDENT_AMBULATORY_CARE_PROVIDER_SITE_OTHER): Payer: 59 | Admitting: Family Medicine

## 2012-05-01 ENCOUNTER — Encounter: Payer: Self-pay | Admitting: Family Medicine

## 2012-05-01 VITALS — BP 120/80 | Temp 98.0°F | Wt 220.0 lb

## 2012-05-01 DIAGNOSIS — R110 Nausea: Secondary | ICD-10-CM | POA: Insufficient documentation

## 2012-05-01 DIAGNOSIS — R1011 Right upper quadrant pain: Secondary | ICD-10-CM | POA: Insufficient documentation

## 2012-05-01 NOTE — Patient Instructions (Signed)
Stay on a clear liquid diet  We will get you set up for an ultrasound of your gallbladder ASAP  Return after your ultrasound for followup

## 2012-05-01 NOTE — Progress Notes (Signed)
  Subjective:    Patient ID: Teresa Reese, female    DOB: Sep 26, 1960, 51 y.o.   MRN: [id number]  HPI Teresa Reese is a 51 year old female nonsmoker who comes in today for evaluation of nausea  She states she felt well until last Friday when she became nauseated and had some chills. Saturday and Sunday seemed about the same and she couldn't eat much she went on a liquid diet. On Monday she began having some diffuse abdominal cramping pain but most focal in the right lower quadrant. However the pain only lasts for a second or 2 and goes away. Today her time he feels fine no pain no fever no vomiting no urinary tract symptoms and no bowel symptoms.  Her mother has a family history of gallbladder disease. She herself has light skin and light eyes is over 40 slightly overweight all the fs  of gallbladder.   Review of Systems Review of systems otherwise negative family history positive for gallbladder disease    Objective:   Physical Exam  Well-developed well-nourished female no acute distress examination the abdomen shows yet and a slightly over obese. Bowel sounds are normal liver spleen kidneys nonpalpable no palpable tenderness no rebound      Assessment & Plan:  Nausea with history of right upper quadrant pain especially with greasy and fatty foods over the past 6-12 months question gallbladder disease plan clear liquid diet and gallbladder ultrasound followup after study

## 2012-05-02 ENCOUNTER — Ambulatory Visit
Admission: RE | Admit: 2012-05-02 | Discharge: 2012-05-02 | Disposition: A | Discharge status: Home / Self Care | Payer: 59 | Source: Ambulatory Visit | Attending: Family Medicine | Admitting: Family Medicine

## 2012-05-02 DIAGNOSIS — R110 Nausea: Secondary | ICD-10-CM

## 2012-05-02 DIAGNOSIS — R1011 Right upper quadrant pain: Secondary | ICD-10-CM

## 2012-05-06 ENCOUNTER — Encounter: Payer: Self-pay | Admitting: Family Medicine

## 2012-05-06 ENCOUNTER — Ambulatory Visit (INDEPENDENT_AMBULATORY_CARE_PROVIDER_SITE_OTHER): Payer: 59 | Admitting: Family Medicine

## 2012-05-06 VITALS — BP 120/80 | Temp 98.3°F | Wt 220.0 lb

## 2012-05-06 DIAGNOSIS — R1011 Right upper quadrant pain: Secondary | ICD-10-CM

## 2012-05-06 DIAGNOSIS — R110 Nausea: Secondary | ICD-10-CM

## 2012-05-06 NOTE — Patient Instructions (Signed)
Continue the fat-free diet  HIDA scan will be ordered ASAP  Call for the report on the HIDA scan a day after your study

## 2012-05-06 NOTE — Progress Notes (Signed)
  Subjective:    Patient ID: Teresa Reese, female    DOB: 1960-10-15, 51 y.o.   MRN: [id number]  HPI Teresa Reese is a 51 year old married female nonsmoker who comes in today for followup of right upper quadrant abdominal pain  We saw her last week with his history. She has a lot of the "fs",,,,,,,,,, over 40 obese skin has had some children and positive family history of gallbladder disease. She was having right upper quadrant abdominal pain PC. We put on a fat-free diet. Did an ultrasound which was normal. She comes in today for followup saying her symptoms are markedly diminished since she's been on a fat-free diet.  We discussed the ultrasound which shows and does not show. Because she is symptomatic and a high risk we've ordered a HIDA scan   Review of Systems    review of systems otherwise negative no new symptoms Objective:   Physical Exam  Well-developed and nourished overweight female in no acute distress      Assessment & Plan:  Right upper quadrant abdominal pain with normal gallbladder ultrasound plan continue fat-free diet all HIDA scan ASAP

## 2012-05-07 ENCOUNTER — Other Ambulatory Visit: Payer: Self-pay | Admitting: Family Medicine

## 2012-05-07 DIAGNOSIS — R1011 Right upper quadrant pain: Secondary | ICD-10-CM

## 2012-05-17 ENCOUNTER — Ambulatory Visit
Admission: RE | Admit: 2012-05-17 | Discharge: 2012-05-17 | Disposition: A | Discharge status: Home / Self Care | Payer: 59 | Source: Ambulatory Visit | Attending: Obstetrics and Gynecology | Admitting: Obstetrics and Gynecology

## 2012-05-17 DIAGNOSIS — Z1231 Encounter for screening mammogram for malignant neoplasm of breast: Secondary | ICD-10-CM

## 2012-05-30 ENCOUNTER — Other Ambulatory Visit: Payer: Self-pay | Admitting: Family Medicine

## 2012-05-30 DIAGNOSIS — R109 Unspecified abdominal pain: Secondary | ICD-10-CM

## 2012-05-31 ENCOUNTER — Encounter (HOSPITAL_COMMUNITY): Payer: 59

## 2012-05-31 ENCOUNTER — Encounter: Payer: Self-pay | Admitting: Internal Medicine

## 2012-06-17 ENCOUNTER — Ambulatory Visit: Payer: 59 | Admitting: Family Medicine

## 2012-06-24 ENCOUNTER — Ambulatory Visit: Payer: 59 | Admitting: Internal Medicine

## 2012-09-16 ENCOUNTER — Telehealth: Payer: Self-pay | Admitting: Family Medicine

## 2012-09-16 NOTE — Telephone Encounter (Signed)
Have her call her pharmacy and asked him if there's anything equivalent that might be cheaper

## 2012-09-16 NOTE — Telephone Encounter (Signed)
Pt would like to know if there is a generic for eletriptan (RELPAX) 40 MG tablet or something equivalent? This med has gone up $35.00 for 4 pills.  [facility]

## 2012-09-17 NOTE — Telephone Encounter (Signed)
Spoke with patient.

## 2013-04-02 ENCOUNTER — Other Ambulatory Visit: Payer: Self-pay

## 2013-04-02 DIAGNOSIS — Z1231 Encounter for screening mammogram for malignant neoplasm of breast: Secondary | ICD-10-CM

## 2013-04-12 ENCOUNTER — Other Ambulatory Visit: Payer: Self-pay | Admitting: Family Medicine

## 2013-05-12 ENCOUNTER — Other Ambulatory Visit: Payer: Self-pay | Admitting: Family Medicine

## 2013-05-19 ENCOUNTER — Ambulatory Visit
Admission: RE | Admit: 2013-05-19 | Discharge: 2013-05-19 | Disposition: A | Discharge status: Home / Self Care | Payer: 59 | Source: Ambulatory Visit

## 2013-05-19 DIAGNOSIS — Z1231 Encounter for screening mammogram for malignant neoplasm of breast: Secondary | ICD-10-CM

## 2013-06-05 ENCOUNTER — Other Ambulatory Visit: Payer: Self-pay | Admitting: Family Medicine

## 2013-06-05 DIAGNOSIS — G43909 Migraine, unspecified, not intractable, without status migrainosus: Secondary | ICD-10-CM

## 2013-06-10 MED ORDER — ELETRIPTAN HYDROBROMIDE 40 MG PO TABS: 40.0000 mg | ORAL_TABLET | ORAL | Status: DC | PRN

## 2013-06-10 NOTE — Addendum Note (Signed)
Addended by: Kern ReapVEREEN, Lekeisha Arenas B on: 06/10/2013 12:24 PM   Modules accepted: Orders

## 2013-06-10 NOTE — Telephone Encounter (Signed)
Pt has appt on 06-24-13

## 2013-06-24 ENCOUNTER — Encounter: Payer: Self-pay | Admitting: Family Medicine

## 2013-06-24 ENCOUNTER — Ambulatory Visit (INDEPENDENT_AMBULATORY_CARE_PROVIDER_SITE_OTHER): Payer: 59 | Admitting: Family Medicine

## 2013-06-24 VITALS — BP 110/80 | Temp 98.3°F | Wt 231.0 lb

## 2013-06-24 DIAGNOSIS — E663 Overweight: Secondary | ICD-10-CM

## 2013-06-24 DIAGNOSIS — D049 Carcinoma in situ of skin, unspecified: Secondary | ICD-10-CM

## 2013-06-24 DIAGNOSIS — G43909 Migraine, unspecified, not intractable, without status migrainosus: Secondary | ICD-10-CM

## 2013-06-24 MED ORDER — TOPIRAMATE 25 MG PO TABS: ORAL_TABLET | ORAL | Status: DC

## 2013-06-24 MED ORDER — ELETRIPTAN HYDROBROMIDE 40 MG PO TABS: 40.0000 mg | ORAL_TABLET | ORAL | Status: DC | PRN

## 2013-06-24 MED ORDER — TOPIRAMATE 100 MG PO TABS: ORAL_TABLET | ORAL | Status: DC

## 2013-06-24 NOTE — Patient Instructions (Signed)
Continue current medications for your migraines  It's time to start a diet and exercise program......... if you would consider nutritional counseling I will be happy to set you up with the folks at the hospital he'll do this

## 2013-06-24 NOTE — Progress Notes (Signed)
   Subjective:    Patient ID: Teresa Reese, female    DOB: Jul 01, 1960, 53 y.o.   MRN: [id number]  HPI Teresa Reese is a 53 year old female nonsmoker who comes in today to renew her migraine medication  She takes 125 mg daily of Topamax and is virtually free of migraines. She'll he has a very occasional 1 what she describes now as a couple times a year. She had one last Sunday took the Relpax immediately and the migraine went away.  Showed a complete physical examination by Dr. Pennie RushingHaygood in September. She's had a history of bones disease of the vulva and Dr. Pennie RushingHaygood is a complete exam yearly. Mammogram December colonoscopy at age 53 normal. No complaints   Review of Systems     Objective:   Physical Exam  Well-developed well-nourished female no acute distress vital signs stable she's afebrile weight 231 pounds      Assessment & Plan:  Migraine headaches under good control continue current therapy  Obesity discussed diet exercise and weight loss

## 2013-06-24 NOTE — Progress Notes (Signed)
Pre visit review using our clinic review tool, if applicable. No additional management support is needed unless otherwise documented below in the visit note. 

## 2013-07-02 ENCOUNTER — Encounter: Payer: 59 | Attending: Family Medicine | Admitting: Dietician

## 2013-07-02 ENCOUNTER — Encounter: Payer: Self-pay | Admitting: Dietician

## 2013-07-02 VITALS — Ht 60.0 in | Wt 226.3 lb

## 2013-07-02 DIAGNOSIS — E669 Obesity, unspecified: Secondary | ICD-10-CM | POA: Insufficient documentation

## 2013-07-02 DIAGNOSIS — Z6830 Body mass index (BMI) 30.0-30.9, adult: Secondary | ICD-10-CM | POA: Insufficient documentation

## 2013-07-02 DIAGNOSIS — Z713 Dietary counseling and surveillance: Secondary | ICD-10-CM | POA: Insufficient documentation

## 2013-07-02 DIAGNOSIS — E663 Overweight: Secondary | ICD-10-CM

## 2013-07-02 NOTE — Progress Notes (Signed)
Medical Nutrition Therapy:  Appt start time: 1130 end time:  1230.  Assessment:  Primary concerns today: obesity, no other notable medical needs.   Preferred Learning Style:   Auditory  Visual  Learning Readiness:   Ready  MEDICATIONS: see list   DIETARY INTAKE: Usual eating pattern includes 3 meals and 1-2 snacks per day. Everyday foods include coffee, light breakfist sandwich.  Avoided foods include juices, regular sodas.    24-hr recall:  B ( AM): [employer] aid[employer]development workerjimmy dean delights (1 sandwich). Coffee with half and half (2 tablespoons)  Snk ( AM): whole grain goldfish. 2nd cup of coffee with steamed skim milk  L ( PM): green giant steamed vegetable mix or leftovers from pervious dinners. Water or diet soda Snk ( PM): sometimes a cup of coffee with half and half D ( PM): curried chicken with brown rice; small baked potato with brussel sprouts and fish; usually protein, veg, starch. Pt estimates about 1/2 to 3/4 cup starch portions, 3-4 oz. Proteins, 1/2-1 cup veg. Usually some olive oil or butter on veg, some light ranch dressing for potatoes, light caesar for salads, salsa or olive oil vinaigrette for salads also an option. water Snk ( PM): occasionally a dessert, otherwise snacks uncommon. water Beverages: coffee, water, decaf tea with splenda, no juices, diet soda, no regular soda, no fruit drinks or gatorade, glass of wine <1 per week.   Usual physical activity: walk on treadmill after work from 30-50 minutes from 3-5 days per week most weeks.   Estimated needs: 1500 kcal, 100 g protein daily  Progress Towards Goal(s):  In progress.   Nutritional Diagnosis:  [place]-3.3 Overweight/obesity As related to low physical activity level, low calorie needs for maintenance.  As evidenced by BMI>30.    Intervention:  Nutrition education provided on eating for general health, and eating for weight loss. Emphasis placed on the Plate Method and other methods of portion control, tracking kcal intake, and  maximizing nonstarchy vegetables as a proportion of daily intake to maintain fullness while limiting kcal.   RD recommendations include: Change half and half in coffee to skim milk Track kcal using MyFitnessPal smartphone app and website 1200 kcal, 100 g protein per day as goals Work up to 60 minutes exercise/physical activity each day Increase fitness and alter exercise patterns using the FITT principle- alter Frequency, Intensity, Time, and Type of exercise to maximize benefits Consider protein supplement from approved list as needed to maintain high protein intake  Teaching Method Utilized:  Visual Auditory  Handouts given during visit include:  Best Protein, Fat, and Carb rich foods  Tested and approved supplements  Barriers to learning/adherence to lifestyle change: low kcal needs to support LBM (due to small stature)  Demonstrated degree of understanding via:  Teach Back   Monitoring/Evaluation:  Dietary intake, exercise, portion control, and body weight in 2 month(s).

## 2013-07-09 ENCOUNTER — Other Ambulatory Visit: Payer: Self-pay | Admitting: Family Medicine

## 2013-07-23 ENCOUNTER — Ambulatory Visit: Payer: 59 | Admitting: Dietician

## 2013-09-04 ENCOUNTER — Ambulatory Visit: Payer: 59 | Admitting: Dietician

## 2013-09-11 ENCOUNTER — Ambulatory Visit: Payer: 59 | Admitting: Dietician

## 2014-03-23 ENCOUNTER — Encounter: Payer: Self-pay | Admitting: Dietician

## 2014-04-20 ENCOUNTER — Other Ambulatory Visit: Payer: Self-pay

## 2014-04-20 DIAGNOSIS — Z1231 Encounter for screening mammogram for malignant neoplasm of breast: Secondary | ICD-10-CM

## 2014-05-05 ENCOUNTER — Other Ambulatory Visit: Payer: Self-pay | Admitting: Family Medicine

## 2014-05-20 ENCOUNTER — Ambulatory Visit: Payer: 59

## 2014-05-20 ENCOUNTER — Ambulatory Visit
Admission: RE | Admit: 2014-05-20 | Discharge: 2014-05-20 | Disposition: A | Discharge status: Home / Self Care | Payer: 59 | Source: Ambulatory Visit

## 2014-05-20 DIAGNOSIS — Z1231 Encounter for screening mammogram for malignant neoplasm of breast: Secondary | ICD-10-CM

## 2014-07-05 ENCOUNTER — Other Ambulatory Visit: Payer: Self-pay | Admitting: Family Medicine

## 2014-07-14 ENCOUNTER — Encounter: Payer: Self-pay | Admitting: Family Medicine

## 2014-07-14 ENCOUNTER — Ambulatory Visit (INDEPENDENT_AMBULATORY_CARE_PROVIDER_SITE_OTHER): Payer: 59 | Admitting: Family Medicine

## 2014-07-14 VITALS — BP 120/80 | Temp 99.4°F | Wt 225.0 lb

## 2014-07-14 DIAGNOSIS — G43909 Migraine, unspecified, not intractable, without status migrainosus: Secondary | ICD-10-CM

## 2014-07-14 DIAGNOSIS — B009 Herpesviral infection, unspecified: Secondary | ICD-10-CM

## 2014-07-14 MED ORDER — ACYCLOVIR 400 MG PO TABS: ORAL_TABLET | ORAL | Status: DC

## 2014-07-14 MED ORDER — TOPIRAMATE 100 MG PO TABS: ORAL_TABLET | ORAL | Status: DC

## 2014-07-14 MED ORDER — FLUOCINONIDE 0.05 % EX SOLN: 1.0000 "application " | Freq: Two times a day (BID) | CUTANEOUS | Status: AC

## 2014-07-14 MED ORDER — ELETRIPTAN HYDROBROMIDE 40 MG PO TABS: ORAL_TABLET | ORAL | Status: DC

## 2014-07-14 NOTE — Progress Notes (Signed)
   Subjective:    Patient ID: Teresa Reese, female    DOB: [birth year], 54 y.o.   MRN: [id number]  HPI Teresa Reese is a 54 year old female who comes in today for evaluation of 2 issues  She has a history of migraine headaches that we have pretty well stopped with the Topamax. She was on 125 mg his decrease the dose 200 and still has no migraines. When she does have an occasional one the oral medication works immediately.  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 She would like a prescription for acyclovir   Review of Systems Review of systems otherwise negative except she has had a physical in a couple years    Objective:   Physical Exam  Well-developed slightly overweight female no acute distress vital signs stable she's afebrile. Examination oral cavity shows HSV lesions 3      Assessment & Plan:  Oral HSV one.............. acyclovir  Migraine headaches well controlled with Topamax...Marland Kitchen.Marland Kitchen.Marland Kitchen. refill Topamax  Return for CPX.

## 2014-07-14 NOTE — Patient Instructions (Addendum)
Acyclovir 400 mg.........Marland Kitchen. 13 times daily into of the oral lesions resolve  Lidex solution........ one drop in each ear canal bedtime when necessary  Topamax 100 mg.......Marland Kitchen. 1 daily to prevent migraines  Return sometime in the next couple months for general physical examination  Fasting labs one week prior

## 2014-07-14 NOTE — Progress Notes (Signed)
Pre visit review using our clinic review tool, if applicable. No additional management support is needed unless otherwise documented below in the visit note. 

## 2014-09-23 ENCOUNTER — Other Ambulatory Visit (INDEPENDENT_AMBULATORY_CARE_PROVIDER_SITE_OTHER): Payer: 59

## 2014-09-23 DIAGNOSIS — B009 Herpesviral infection, unspecified: Secondary | ICD-10-CM | POA: Diagnosis not present

## 2014-09-23 DIAGNOSIS — G43909 Migraine, unspecified, not intractable, without status migrainosus: Secondary | ICD-10-CM | POA: Diagnosis not present

## 2014-09-23 DIAGNOSIS — Z0000 Encounter for general adult medical examination without abnormal findings: Secondary | ICD-10-CM

## 2014-09-23 LAB — CBC WITH DIFFERENTIAL/PLATELET
BASOS PCT: 0.7 % (ref 0.0–3.0)
Basophils Absolute: 0 10*3/uL (ref 0.0–0.1)
EOS ABS: 0.4 10*3/uL (ref 0.0–0.7)
EOS PCT: 5.6 % — AB (ref 0.0–5.0)
HEMATOCRIT: 41.4 % (ref 36.0–46.0)
HEMOGLOBIN: 14 g/dL (ref 12.0–15.0)
Lymphocytes Relative: 29.9 % (ref 12.0–46.0)
Lymphs Abs: 1.9 10*3/uL (ref 0.7–4.0)
MCHC: 33.7 g/dL (ref 30.0–36.0)
MCV: 89.7 fl (ref 78.0–100.0)
MONO ABS: 0.5 10*3/uL (ref 0.1–1.0)
MONOS PCT: 7.3 % (ref 3.0–12.0)
NEUTROS ABS: 3.6 10*3/uL (ref 1.4–7.7)
Neutrophils Relative %: 56.5 % (ref 43.0–77.0)
PLATELETS: 332 10*3/uL (ref 150.0–400.0)
RBC: 4.62 Mil/uL (ref 3.87–5.11)
RDW: 13.5 % (ref 11.5–15.5)
WBC: 6.3 10*3/uL (ref 4.0–10.5)

## 2014-09-23 LAB — POCT URINALYSIS DIPSTICK
BILIRUBIN UA: NEGATIVE
Blood, UA: NEGATIVE
GLUCOSE UA: NEGATIVE
KETONES UA: NEGATIVE
LEUKOCYTES UA: NEGATIVE
Nitrite, UA: NEGATIVE
Protein, UA: NEGATIVE
SPEC GRAV UA: 1.01
Urobilinogen, UA: 0.2
pH, UA: 5

## 2014-09-23 LAB — BASIC METABOLIC PANEL
BUN: 13 mg/dL (ref 6–23)
CO2: 28 meq/L (ref 19–32)
CREATININE: 0.71 mg/dL (ref 0.40–1.20)
Calcium: 10.1 mg/dL (ref 8.4–10.5)
Chloride: 106 mEq/L (ref 96–112)
GFR: 91.25 mL/min (ref >60.00)
Glucose, Bld: 88 mg/dL (ref 70–99)
POTASSIUM: 4.6 meq/L (ref 3.5–5.1)
SODIUM: 140 meq/L (ref 135–145)

## 2014-09-23 LAB — HEPATIC FUNCTION PANEL
ALBUMIN: 4.2 g/dL (ref 3.5–5.2)
ALT: 21 U/L (ref 0–35)
AST: 19 U/L (ref 0–37)
Alkaline Phosphatase: 69 U/L (ref 39–117)
Bilirubin, Direct: 0.1 mg/dL (ref 0.0–0.3)
Total Bilirubin: 0.3 mg/dL (ref 0.2–1.2)
Total Protein: 7.3 g/dL (ref 6.0–8.3)

## 2014-09-23 LAB — LIPID PANEL
CHOLESTEROL: 196 mg/dL (ref 0–200)
HDL: 57.2 mg/dL (ref >39.00)
LDL CALC: 115 mg/dL — AB (ref 0–99)
NONHDL: 138.8
TRIGLYCERIDES: 119 mg/dL (ref 0.0–149.0)
Total CHOL/HDL Ratio: 3
VLDL: 23.8 mg/dL (ref 0.0–40.0)

## 2014-09-23 LAB — TSH: TSH: 0.82 u[IU]/mL (ref 0.35–4.50)

## 2014-09-24 ENCOUNTER — Other Ambulatory Visit: Payer: Self-pay | Admitting: Physical Medicine and Rehabilitation

## 2014-09-24 DIAGNOSIS — M545 Low back pain: Secondary | ICD-10-CM

## 2014-09-28 ENCOUNTER — Ambulatory Visit
Admission: RE | Admit: 2014-09-28 | Discharge: 2014-09-28 | Disposition: A | Discharge status: Home / Self Care | Payer: 59 | Source: Ambulatory Visit | Attending: Physical Medicine and Rehabilitation | Admitting: Physical Medicine and Rehabilitation

## 2014-09-28 DIAGNOSIS — M545 Low back pain: Secondary | ICD-10-CM

## 2014-09-29 ENCOUNTER — Encounter: Payer: Self-pay | Admitting: Family Medicine

## 2014-09-29 ENCOUNTER — Ambulatory Visit (INDEPENDENT_AMBULATORY_CARE_PROVIDER_SITE_OTHER): Payer: 59 | Admitting: Family Medicine

## 2014-09-29 VITALS — BP 110/80 | Temp 98.2°F | Ht 59.75 in | Wt 227.0 lb

## 2014-09-29 DIAGNOSIS — Z0000 Encounter for general adult medical examination without abnormal findings: Secondary | ICD-10-CM | POA: Diagnosis not present

## 2014-09-29 DIAGNOSIS — Z23 Encounter for immunization: Secondary | ICD-10-CM | POA: Diagnosis not present

## 2014-09-29 DIAGNOSIS — E663 Overweight: Secondary | ICD-10-CM

## 2014-09-29 DIAGNOSIS — G43909 Migraine, unspecified, not intractable, without status migrainosus: Secondary | ICD-10-CM

## 2014-09-29 DIAGNOSIS — B009 Herpesviral infection, unspecified: Secondary | ICD-10-CM

## 2014-09-29 MED ORDER — ACYCLOVIR 400 MG PO TABS: ORAL_TABLET | ORAL | Status: DC

## 2014-09-29 MED ORDER — ELETRIPTAN HYDROBROMIDE 40 MG PO TABS: ORAL_TABLET | ORAL | Status: DC

## 2014-09-29 MED ORDER — TOPIRAMATE 100 MG PO TABS: ORAL_TABLET | ORAL | Status: DC

## 2014-09-29 NOTE — Patient Instructions (Addendum)
Continue current medications  Begin a diet and exercise program as we discussed  Follow-up in one year sooner if any problems  Rachel's extension is [phone]  [employer] Energy[employer]ory nafzinger............Marland Kitchen. [facility]

## 2014-09-29 NOTE — Progress Notes (Signed)
Pre visit review using our clinic review tool, if applicable. No additional management support is needed unless otherwise documented below in the visit note. 

## 2014-09-29 NOTE — Progress Notes (Signed)
   Subjective:    Patient ID: Teresa Reese, female    DOB: 1960-08-10, 54 y.o.   MRN: [id number]  HPI Teresa Reese is a 54 year old married female nonsmoker who comes in today for general physical examination because of a history of migraine headaches and obesity  Her weight now is 227 pounds. We center at one time to the nutrition clinic she went once and then go back. She knows what she has to do. We talked about a carbohydrate free diet and walking programs  She gets routine eye care, dental care, BSE monthly, annual mammography, colonoscopy at another facility 2013 normal, Pap last when she was 36. At that time she had her uterus removed for nonmalignant reasons. Ovaries were left intact. She sees her GYN on a yearly basis  Tetanus booster 2006...Marland Kitchen... 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 She said she had a bad migraine on Saturday the Relpax didn't help. I discussed increasing her Topamax but she would like to just leave alone for now.   Review of Systems  Constitutional: Negative.   HENT: Negative.   Eyes: Negative.   Respiratory: Negative.   Cardiovascular: Negative.   Gastrointestinal: Negative.   Endocrine: Negative.   Genitourinary: Negative.   Musculoskeletal: Negative.   Skin: Negative.   Allergic/Immunologic: Negative.   Neurological: Negative.   Hematological: Negative.   Psychiatric/Behavioral: Negative.        Objective:   Physical Exam  Constitutional: She appears well-developed and well-nourished.  HENT:  Head: Normocephalic and atraumatic.  Right Ear: External ear normal.  Left Ear: External ear normal.  Nose: Nose normal.  Mouth/Throat: Oropharynx is clear and moist.  Eyes: EOM are normal. Pupils are equal, round, and reactive to light.  Neck: Normal range of motion. Neck supple. No JVD present. No tracheal deviation present. No  thyromegaly present.  Cardiovascular: Normal rate, regular rhythm, normal heart sounds and intact distal pulses.  Exam reveals no gallop and no friction rub.   No murmur heard. Pulmonary/Chest: Effort normal and breath sounds normal. No stridor. No respiratory distress. She has no wheezes. She has no rales. She exhibits no tenderness.  Abdominal: Soft. Bowel sounds are normal. She exhibits no distension and no mass. There is no tenderness. There is no rebound and no guarding.  Genitourinary:  Bilateral breast exam normal  Musculoskeletal: Normal range of motion.  Lymphadenopathy:    She has no cervical adenopathy.  Neurological: She is alert. She has normal reflexes. No cranial nerve deficit. She exhibits normal muscle tone. Coordination normal.  Skin: Skin is warm and dry. No rash noted. No erythema. No pallor.  Total body skin exam normal  Psychiatric: She has a normal mood and affect. Her behavior is normal. Judgment and thought content normal.  Nursing note and vitals reviewed.         Assessment & Plan:  Healthy female  Obesity.......... as we have done many times in the past discussed diet exercise and weight loss  Migraine headaches.......... continue Topamax daily Relpax when necessary  History of HSV-1..... Resell acyclovir

## 2015-08-24 ENCOUNTER — Ambulatory Visit: Payer: 59 | Admitting: Family Medicine

## 2016-06-14 DIAGNOSIS — S83206A Unspecified tear of unspecified meniscus, current injury, right knee, initial encounter: Secondary | ICD-10-CM | POA: Diagnosis not present

## 2016-06-15 DIAGNOSIS — Z1231 Encounter for screening mammogram for malignant neoplasm of breast: Secondary | ICD-10-CM | POA: Diagnosis not present

## 2016-06-15 DIAGNOSIS — Z01419 Encounter for gynecological examination (general) (routine) without abnormal findings: Secondary | ICD-10-CM | POA: Diagnosis not present

## 2016-07-11 ENCOUNTER — Ambulatory Visit (INDEPENDENT_AMBULATORY_CARE_PROVIDER_SITE_OTHER): Payer: 59 | Admitting: Family Medicine

## 2016-07-11 ENCOUNTER — Encounter: Payer: Self-pay | Admitting: Family Medicine

## 2016-07-11 VITALS — BP 120/90 | Temp 97.9°F | Ht 59.0 in | Wt 215.7 lb

## 2016-07-11 DIAGNOSIS — G43909 Migraine, unspecified, not intractable, without status migrainosus: Secondary | ICD-10-CM

## 2016-07-11 DIAGNOSIS — E663 Overweight: Secondary | ICD-10-CM | POA: Diagnosis not present

## 2016-07-11 MED ORDER — ELETRIPTAN HYDROBROMIDE 40 MG PO TABS
ORAL_TABLET | ORAL | 6 refills | Status: DC
Start: 2016-07-11 — End: 2017-08-16

## 2016-07-11 NOTE — Progress Notes (Signed)
Pre visit review using our clinic review tool, if applicable. No additional management support is needed unless otherwise documented below in the visit note. 

## 2016-07-11 NOTE — Progress Notes (Signed)
Teresa Reese is a 56 year old married female nonsmoker who comes in today for follow-up of migraine headaches  When she was premenopausal we had her on Topamax 100 mg daily with a when necessary Relpax 40 mg when necessary. Since she's gone through menopause she stopped the Topamax and now only has an occasional migraine which he describes as one every 4-6 weeks. We discussed restarting the Topamax however she or inclined just to take the Relpax when necessary since it works.  She had a complete physical examination by her gynecologist recently. All that was normal except her weight which is 215 pounds. Her GYN told her to ask me about diet pills. I explained to Teresa Reese that I don't use diet pills. The best is Weight [employer]. She states she's been there before.  She gets routine eye care, dental care, colonoscopy at age 56 normal  Vaccinations up-to-date  BP 120/90 (BP Location: Left Arm, Patient Position: Sitting, Cuff Size: Normal)   Temp 97.9 F (36.6 C) (Oral)   Ht 4\' 11"  (1.499 m)   Wt 215 lb 11.2 oz (97.8 kg)   BMI 43.57 kg/m  In general she is well-developed well-nourished female no acute distress  #1 migraine headaches........Marland Kitchen. restart the Relpax when necessary

## 2016-07-11 NOTE — Patient Instructions (Signed)
Relpax 40 mg,,,,,,,,,,,,,, one orally at the first sign of a migraine  If you wish to restart the Topamax call  Again I would recommend [employer]

## 2016-07-12 DIAGNOSIS — M25562 Pain in left knee: Secondary | ICD-10-CM | POA: Diagnosis not present

## 2016-07-18 DIAGNOSIS — M25562 Pain in left knee: Secondary | ICD-10-CM | POA: Diagnosis not present

## 2016-07-31 DIAGNOSIS — M25562 Pain in left knee: Secondary | ICD-10-CM | POA: Diagnosis not present

## 2016-08-17 DIAGNOSIS — G8918 Other acute postprocedural pain: Secondary | ICD-10-CM | POA: Diagnosis not present

## 2016-08-17 DIAGNOSIS — S83232A Complex tear of medial meniscus, current injury, left knee, initial encounter: Secondary | ICD-10-CM | POA: Diagnosis not present

## 2016-08-17 DIAGNOSIS — M2242 Chondromalacia patellae, left knee: Secondary | ICD-10-CM | POA: Diagnosis not present

## 2016-08-28 DIAGNOSIS — M25562 Pain in left knee: Secondary | ICD-10-CM | POA: Diagnosis not present

## 2016-08-28 DIAGNOSIS — R262 Difficulty in walking, not elsewhere classified: Secondary | ICD-10-CM | POA: Diagnosis not present

## 2016-08-28 DIAGNOSIS — M25662 Stiffness of left knee, not elsewhere classified: Secondary | ICD-10-CM | POA: Diagnosis not present

## 2016-09-19 ENCOUNTER — Ambulatory Visit (HOSPITAL_COMMUNITY)
Admission: RE | Admit: 2016-09-19 | Discharge: 2016-09-19 | Disposition: A | Discharge status: Home / Self Care | Payer: 59 | Source: Ambulatory Visit | Attending: Orthopaedic Surgery | Admitting: Orthopaedic Surgery

## 2016-09-19 ENCOUNTER — Other Ambulatory Visit (HOSPITAL_COMMUNITY): Payer: Self-pay | Admitting: Orthopaedic Surgery

## 2016-09-19 DIAGNOSIS — M7989 Other specified soft tissue disorders: Principal | ICD-10-CM

## 2016-09-19 DIAGNOSIS — M79605 Pain in left leg: Secondary | ICD-10-CM | POA: Diagnosis not present

## 2016-09-19 DIAGNOSIS — M25562 Pain in left knee: Secondary | ICD-10-CM | POA: Diagnosis not present

## 2016-09-19 NOTE — Progress Notes (Signed)
VASCULAR LAB PRELIMINARY  PRELIMINARY  PRELIMINARY  PRELIMINARY  Left lower extremity venous duplex completed.    Preliminary report:  Left:  No evidence of DVT, superficial thrombosis, or Baker's cyst.  Patryck Kilgore, RVS 09/19/2016, 3:18 PM

## 2016-10-31 ENCOUNTER — Other Ambulatory Visit: Payer: Self-pay | Admitting: Family Medicine

## 2016-11-06 DIAGNOSIS — M25562 Pain in left knee: Secondary | ICD-10-CM | POA: Diagnosis not present

## 2016-12-04 DIAGNOSIS — M25562 Pain in left knee: Secondary | ICD-10-CM | POA: Diagnosis not present

## 2016-12-06 DIAGNOSIS — L570 Actinic keratosis: Secondary | ICD-10-CM | POA: Diagnosis not present

## 2016-12-06 DIAGNOSIS — L720 Epidermal cyst: Secondary | ICD-10-CM | POA: Diagnosis not present

## 2016-12-06 DIAGNOSIS — L814 Other melanin hyperpigmentation: Secondary | ICD-10-CM | POA: Diagnosis not present

## 2016-12-06 DIAGNOSIS — L821 Other seborrheic keratosis: Secondary | ICD-10-CM | POA: Diagnosis not present

## 2016-12-11 DIAGNOSIS — M1712 Unilateral primary osteoarthritis, left knee: Secondary | ICD-10-CM | POA: Diagnosis not present

## 2016-12-18 DIAGNOSIS — M1712 Unilateral primary osteoarthritis, left knee: Secondary | ICD-10-CM | POA: Diagnosis not present

## 2016-12-19 IMAGING — MR MR LUMBAR SPINE W/O CM
4 of 5 series · 19 of 48 positions shown · non-contrast
Comparison: 01/28/2010

CLINICAL DATA: Left leg pain, 5 years duration.

EXAM:
MRI LUMBAR SPINE WITHOUT CONTRAST
TECHNIQUE: Multiplanar, multisequence MR imaging of the lumbar spine was
performed. No intravenous contrast was administered.

[Series 3: T1 · sagittal · 4.0mm · 0.51mm/px · 3 of 12 slices shown (1 of 2)]
[im 3/12]
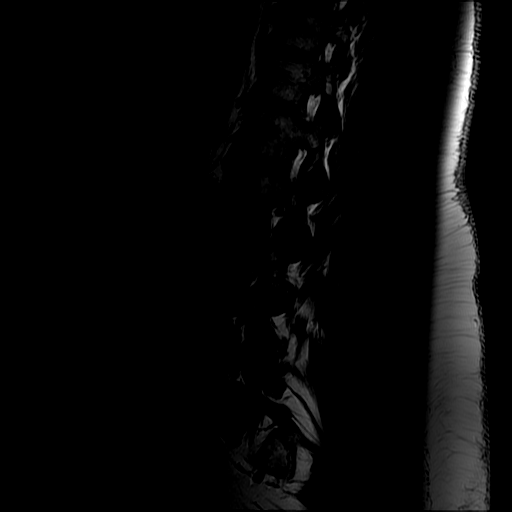
[im 7/12]
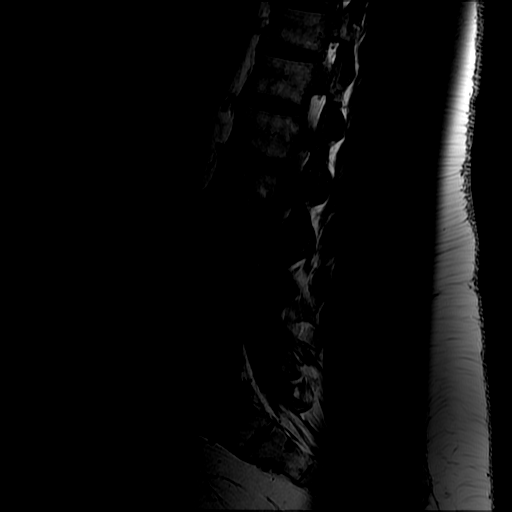
[im 12/12]
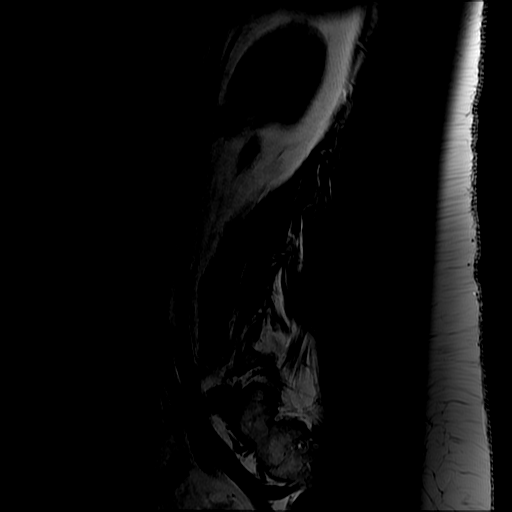

[Series 4: T2 · sagittal · 4.0mm · 0.51mm/px · 6 of 12 slices shown (1 of 2)]
[im 1/12]
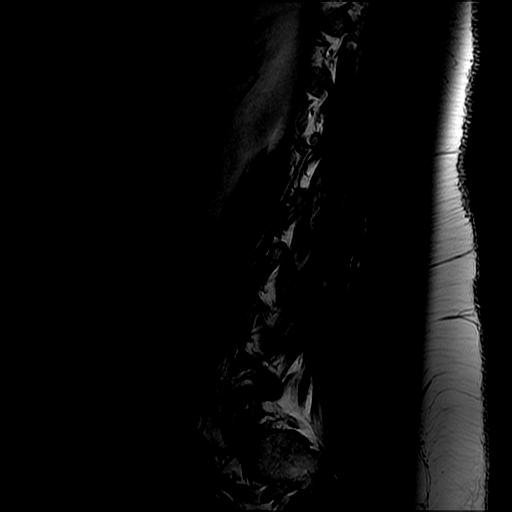
[im 3/12]
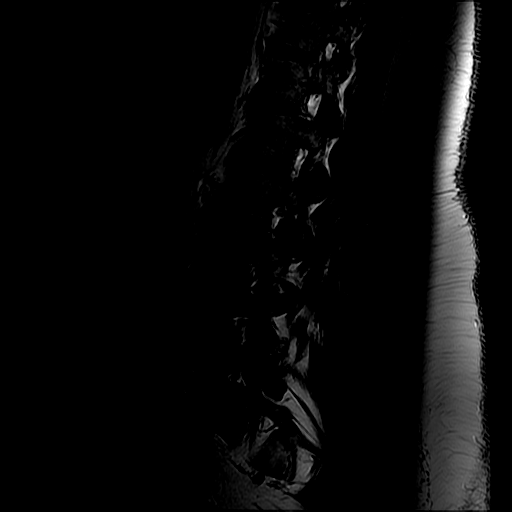
[im 5/12]
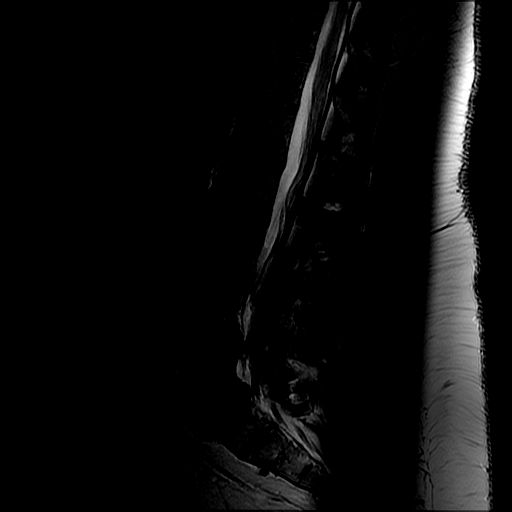
[im 7/12]
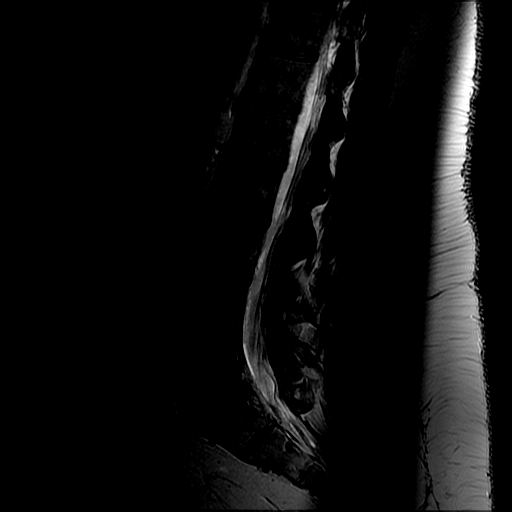
[im 9/12]
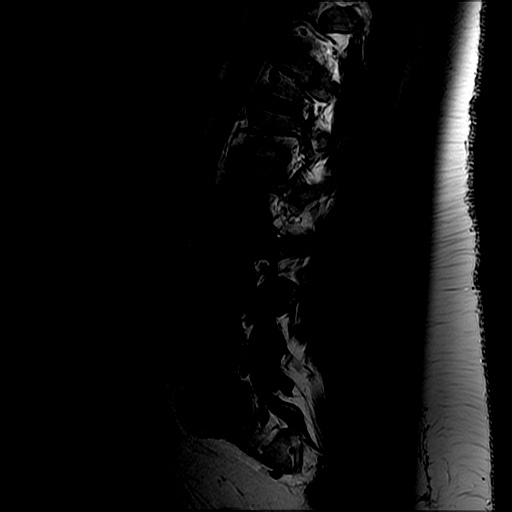
[im 12/12]
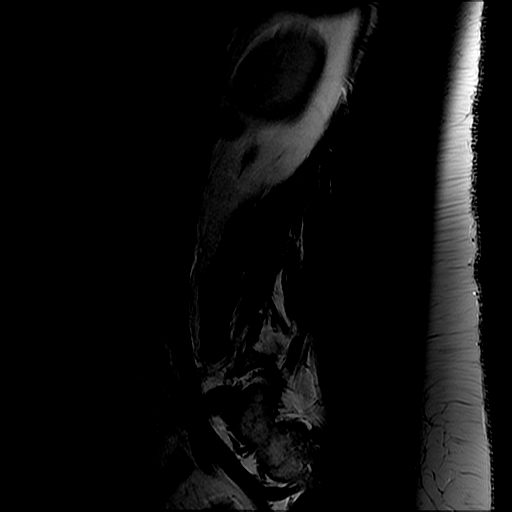

[Series 6: T2 · axial · 4.0mm · 0.39mm/px · z∈[-92,+56]mm · 7 of 29 slices shown (2 of 2)]
[im 1/29]
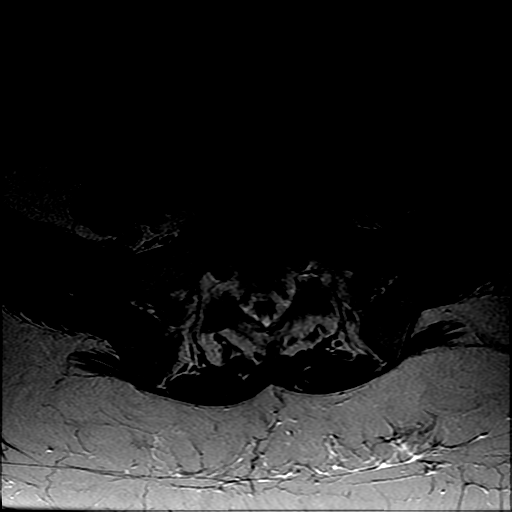
[im 5/29]
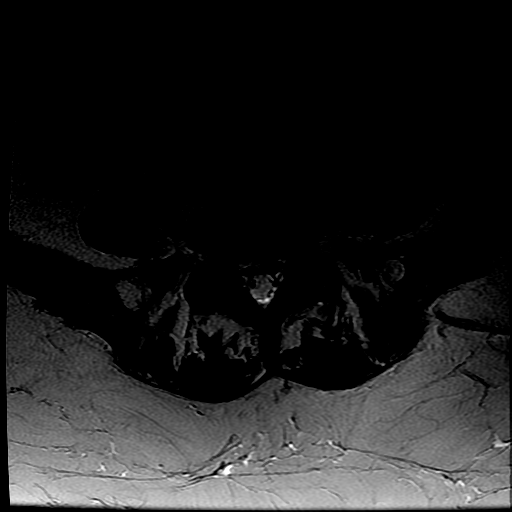
[im 9/29]
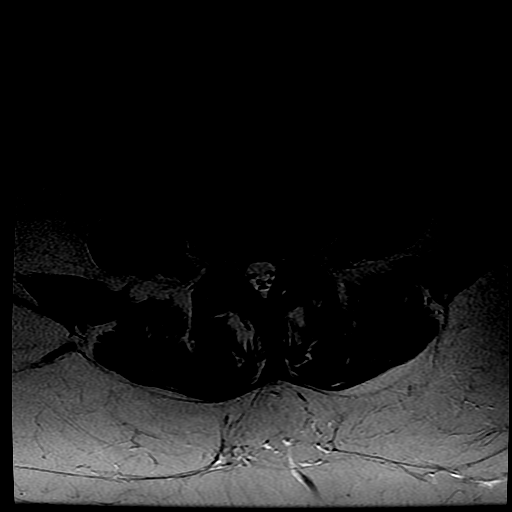
[im 13/29]
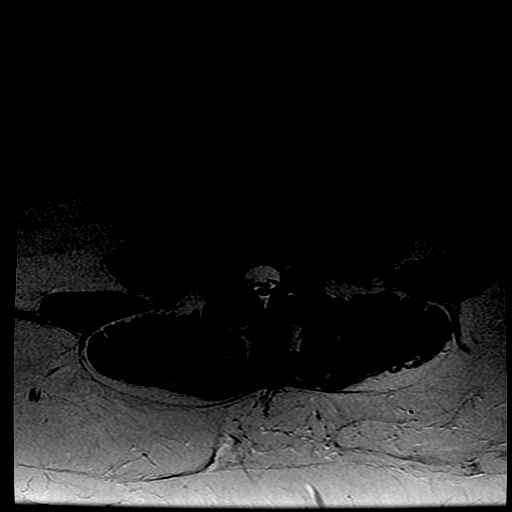
[im 15/29]
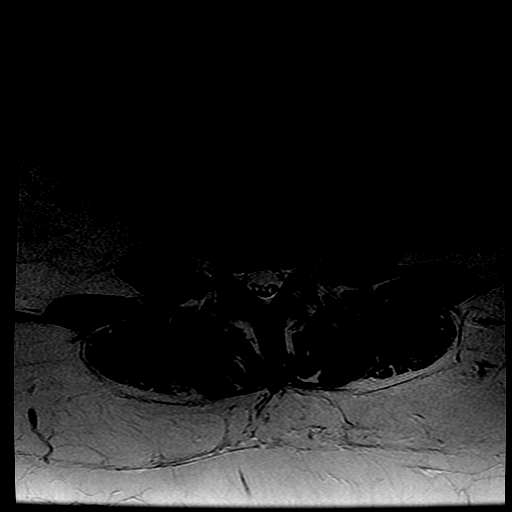
[im 17/29]
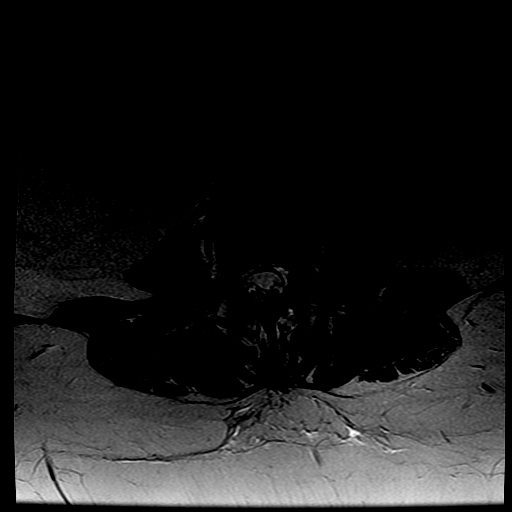
[im 25/29]
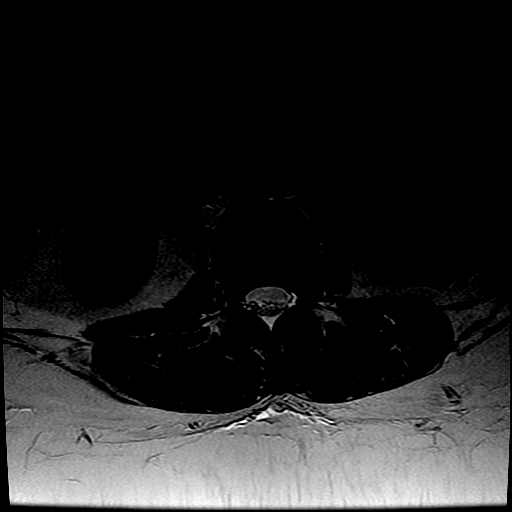

[Series 7: T1 · axial · 4.0mm · 0.39mm/px · z∈[-72,+56]mm · 3 of 29 slices shown (2 of 2)]
[im 5/29]
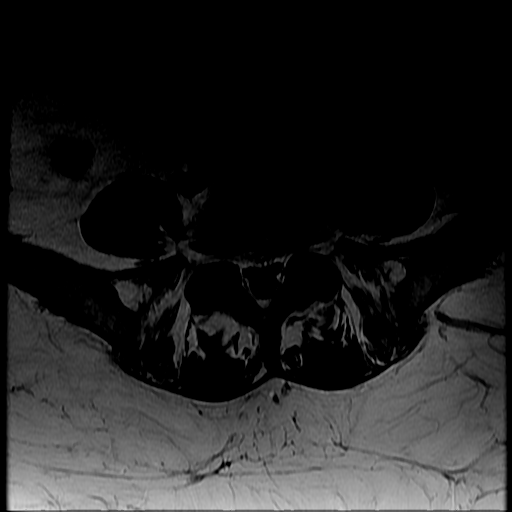
[im 15/29]
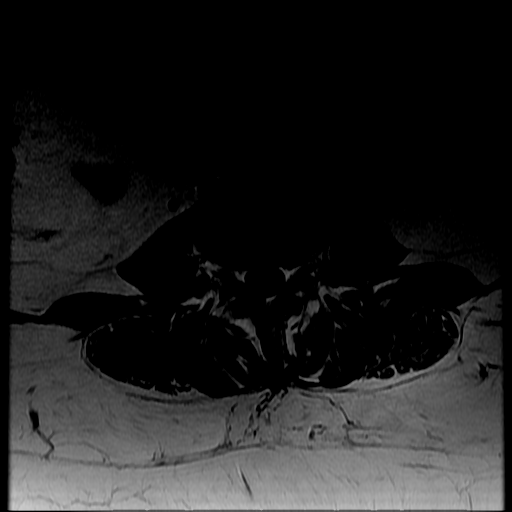
[im 25/29]
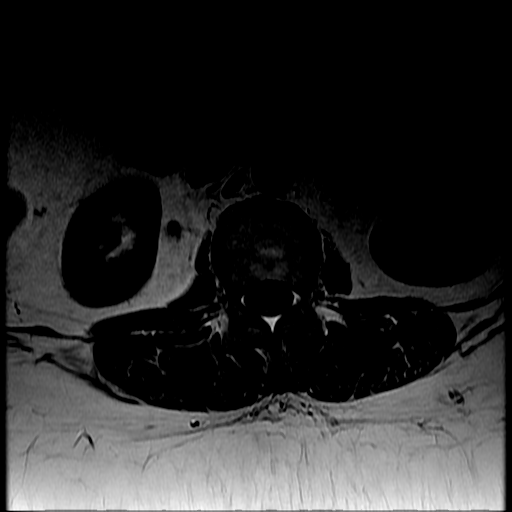

[19 of 48 positions shown; findings below may reference images not displayed]

FINDINGS: There is no abnormality at L2-3 or above. The discs are normal. The
canal and foramina are widely patent. The distal cord and conus are
normal.

L3-4: Mild desiccation of the disc but no bulge or herniation. No
stenosis.

L4-5: Desiccation of the disc. Annular tearing with small disc
herniation in the left foraminal to extra foraminal region. This
would have potential to affect the left L4 nerve root. No central
canal stenosis.

L5-S1: Desiccation of the disc with loss of height. Circumferential
bulging. No central canal stenosis. Mild foraminal narrowing
bilaterally without apparent neural compression.

Since the previous study, the abnormality at L4-5 is newly seen.
IMPRESSION: Mild, non-compressive degenerative changes at L3-4 and L5-S1.

At L4-5, the patient has developed left foraminal to extra foraminal
annular fissures and a small disc herniation. This approaches the
left L4 nerve root and could irritate that structure.

## 2017-03-05 DIAGNOSIS — Z23 Encounter for immunization: Secondary | ICD-10-CM | POA: Diagnosis not present

## 2017-05-03 DIAGNOSIS — H524 Presbyopia: Secondary | ICD-10-CM | POA: Diagnosis not present

## 2017-06-18 DIAGNOSIS — Z1231 Encounter for screening mammogram for malignant neoplasm of breast: Secondary | ICD-10-CM | POA: Diagnosis not present

## 2017-06-18 DIAGNOSIS — Z01419 Encounter for gynecological examination (general) (routine) without abnormal findings: Secondary | ICD-10-CM | POA: Diagnosis not present

## 2017-08-16 ENCOUNTER — Telehealth: Payer: Self-pay | Admitting: Family Medicine

## 2017-08-16 ENCOUNTER — Other Ambulatory Visit: Payer: Self-pay | Admitting: *Deleted

## 2017-08-16 MED ORDER — ELETRIPTAN HYDROBROMIDE 40 MG PO TABS: ORAL_TABLET | ORAL | 0 refills | Status: DC

## 2017-08-16 NOTE — Telephone Encounter (Signed)
Copied from CRM [phone]. Topic: Quick Communication - Rx Refill/Question >> Aug 16, 2017  3:41 PM Alexander BergeronBarksdale, Harvey B wrote:  Pt looking to get a refill; pt has appt on 4.9.19 @ 11   Medication: eletriptan (RELPAX) 40 MG tablet [[id number]][[id number]]  Has the patient contacted their pharmacy? Yes.   (Agent: If no, request that the patient contact the pharmacy for the refill.) Preferred Pharmacy (with phone number or street name): [email or web address] Agent: Please be advised that RX refills may take up to 3 business days. We ask that you follow-up with your pharmacy.

## 2017-08-16 NOTE — Telephone Encounter (Signed)
Rx refilled per protocol- 1 with 0 additional. Patient has appointment 08/28/17

## 2017-08-28 ENCOUNTER — Ambulatory Visit: Payer: 59 | Admitting: Family Medicine

## 2017-09-11 DIAGNOSIS — J22 Unspecified acute lower respiratory infection: Secondary | ICD-10-CM | POA: Diagnosis not present

## 2017-09-11 DIAGNOSIS — J019 Acute sinusitis, unspecified: Secondary | ICD-10-CM | POA: Diagnosis not present

## 2017-09-11 DIAGNOSIS — B9689 Other specified bacterial agents as the cause of diseases classified elsewhere: Secondary | ICD-10-CM | POA: Diagnosis not present

## 2017-10-16 ENCOUNTER — Ambulatory Visit: Payer: 59 | Admitting: Family Medicine

## 2017-10-16 ENCOUNTER — Encounter: Payer: Self-pay | Admitting: Family Medicine

## 2017-10-16 VITALS — BP 108/76 | HR 76 | Temp 98.4°F | Wt 225.0 lb

## 2017-10-16 DIAGNOSIS — G43909 Migraine, unspecified, not intractable, without status migrainosus: Secondary | ICD-10-CM | POA: Diagnosis not present

## 2017-10-16 MED ORDER — ACYCLOVIR 400 MG PO TABS: ORAL_TABLET | ORAL | 3 refills | Status: AC

## 2017-10-16 MED ORDER — ONDANSETRON HCL 4 MG PO TABS: 4.0000 mg | ORAL_TABLET | Freq: Three times a day (TID) | ORAL | 1 refills | Status: AC | PRN

## 2017-10-16 MED ORDER — ELETRIPTAN HYDROBROMIDE 40 MG PO TABS: ORAL_TABLET | ORAL | 1 refills | Status: AC

## 2017-10-16 NOTE — Patient Instructions (Signed)
Continue current treatment program.  Zofran 4 mg...........Marland Kitchen 1 tablet every 6-8 hours. For nausea  Return in one year for follow-up sooner if any problems

## 2017-10-16 NOTE — Progress Notes (Signed)
Teresa Reese is a 57 year old married female nonsmoker who comes in today for follow-up of migraine headaches  She has a migraine headache usually with weather changes. She says A decreased in frequency over the last 5 years. Now she has about 12 migraines per year. There was start with pain in her back of her head with severe headache following. She has no aura. She does get nausea but no vomiting. She takes Relpax stat 40 mg and most the time that relieve the headache. Occasionally she'll have to take 2.  She had a physical exam by Dr. Trenda Moots last fall. She is up in our health maintenance activities. Colonoscopy is done at age 4.  She states it was normal. She gets her mammogram through her GYN.  BP 108/76 (BP Location: Left Arm, Patient Position: Sitting, Cuff Size: Large)   Pulse 76   Temp 98.4 F (36.9 C) (Oral)   Wt 225 lb (102.1 kg)   BMI 45.44 kg/m  Well-developed well-nourished female no acute distress vital signs stable she's afebrile  #1 migraine headaches....... continue current therapy add Zofran 4 mg every 6 hours when necessary for nausea with the migraines

## 2018-02-19 DIAGNOSIS — H7292 Unspecified perforation of tympanic membrane, left ear: Secondary | ICD-10-CM | POA: Diagnosis not present

## 2018-02-19 DIAGNOSIS — J069 Acute upper respiratory infection, unspecified: Secondary | ICD-10-CM | POA: Diagnosis not present

## 2018-02-26 DIAGNOSIS — H73892 Other specified disorders of tympanic membrane, left ear: Secondary | ICD-10-CM | POA: Diagnosis not present

## 2018-02-26 DIAGNOSIS — J343 Hypertrophy of nasal turbinates: Secondary | ICD-10-CM | POA: Diagnosis not present

## 2018-02-26 DIAGNOSIS — H9202 Otalgia, left ear: Secondary | ICD-10-CM | POA: Diagnosis not present

## 2018-02-27 DIAGNOSIS — Z23 Encounter for immunization: Secondary | ICD-10-CM | POA: Diagnosis not present

## 2018-03-12 DIAGNOSIS — H918X9 Other specified hearing loss, unspecified ear: Secondary | ICD-10-CM | POA: Diagnosis not present

## 2018-03-12 DIAGNOSIS — H9012 Conductive hearing loss, unilateral, left ear, with unrestricted hearing on the contralateral side: Secondary | ICD-10-CM | POA: Diagnosis not present

## 2018-03-12 DIAGNOSIS — H7292 Unspecified perforation of tympanic membrane, left ear: Secondary | ICD-10-CM | POA: Diagnosis not present

## 2019-07-19 ENCOUNTER — Ambulatory Visit: Payer: 59 | Attending: Internal Medicine

## 2019-07-19 DIAGNOSIS — Z23 Encounter for immunization: Secondary | ICD-10-CM | POA: Insufficient documentation

## 2019-07-19 NOTE — Progress Notes (Signed)
   Covid-19 Vaccination Clinic  Name:  Teresa Reese    MRN: [id number] DOB: March 09, 1961  07/19/2019  Teresa Reese was observed post Covid-19 immunization for 15 minutes without incidence. She was provided with Vaccine Information Sheet and instruction to access the V-Safe system.   Teresa Reese was instructed to call 911 with any severe reactions post vaccine: Marland Kitchen Difficulty breathing  . Swelling of your face and throat  . A fast heartbeat  . A bad rash all over your body  . Dizziness and weakness    Immunizations Administered    Name Date Dose VIS Date Route   Pfizer COVID-19 Vaccine 07/19/2019 12:39 PM 0.3 mL 05/02/2019 Intramuscular   Manufacturer: [employer], [employer]   Lot: [id number]   NDC: [address]-6

## 2019-08-09 ENCOUNTER — Ambulatory Visit: Payer: 59

## 2019-08-13 ENCOUNTER — Ambulatory Visit: Payer: 59

## 2019-08-18 ENCOUNTER — Ambulatory Visit: Payer: 59 | Attending: Internal Medicine

## 2019-08-18 DIAGNOSIS — Z23 Encounter for immunization: Secondary | ICD-10-CM

## 2019-08-18 NOTE — Progress Notes (Signed)
   Covid-19 Vaccination Clinic  Name:  Teresa Reese    MRN: [id number] DOB: June 10, 1960  08/18/2019  [name] was observed post Covid-19 immunization for 15 minutes without incident. She was provided with Vaccine Information Sheet and instruction to access the V-Safe system.   [name] was instructed to call 911 with any severe reactions post vaccine: Marland Kitchen Difficulty breathing  . Swelling of face and throat  . A fast heartbeat  . A bad rash all over body  . Dizziness and weakness   Immunizations Administered    Name Date Dose VIS Date Route   Pfizer COVID-19 Vaccine 08/18/2019 10:09 AM 0.3 mL 05/02/2019 Intramuscular   Manufacturer: [employer], [employer]   Lot: [id number]   NDC: [address]-1

## 2022-06-08 ENCOUNTER — Other Ambulatory Visit: Payer: Self-pay | Admitting: Family Medicine

## 2022-06-08 DIAGNOSIS — Z1231 Encounter for screening mammogram for malignant neoplasm of breast: Secondary | ICD-10-CM

## 2022-07-28 ENCOUNTER — Ambulatory Visit
Admission: RE | Admit: 2022-07-28 | Discharge: 2022-07-28 | Disposition: A | Discharge status: Home / Self Care | Payer: 59 | Source: Ambulatory Visit | Attending: Family Medicine | Admitting: Family Medicine

## 2022-07-28 DIAGNOSIS — Z1231 Encounter for screening mammogram for malignant neoplasm of breast: Secondary | ICD-10-CM

## 2023-06-13 ENCOUNTER — Other Ambulatory Visit: Payer: Self-pay | Admitting: Cardiology

## 2023-06-13 DIAGNOSIS — Z1231 Encounter for screening mammogram for malignant neoplasm of breast: Secondary | ICD-10-CM

## 2023-08-02 ENCOUNTER — Ambulatory Visit
Admission: RE | Admit: 2023-08-02 | Discharge: 2023-08-02 | Discharge status: Home / Self Care | Payer: 59 | Source: Ambulatory Visit | Attending: Cardiology | Admitting: Cardiology

## 2023-08-02 DIAGNOSIS — Z1231 Encounter for screening mammogram for malignant neoplasm of breast: Secondary | ICD-10-CM
